# Patient Record
Sex: Male | Born: 1965
Health system: Southern US, Community
[De-identification: ages and names within clinical notes are randomized; demographics above are authoritative.]

## PROBLEM LIST (undated history)

## (undated) DIAGNOSIS — S83249A Other tear of medial meniscus, current injury, unspecified knee, initial encounter: Secondary | ICD-10-CM

## (undated) DIAGNOSIS — M1990 Unspecified osteoarthritis, unspecified site: Secondary | ICD-10-CM

## (undated) DIAGNOSIS — J45909 Unspecified asthma, uncomplicated: Secondary | ICD-10-CM

## (undated) DIAGNOSIS — K219 Gastro-esophageal reflux disease without esophagitis: Secondary | ICD-10-CM

## (undated) DIAGNOSIS — D6851 Activated protein C resistance: Secondary | ICD-10-CM

## (undated) HISTORY — PX: VASECTOMY: SHX75

## (undated) HISTORY — DX: Activated protein C resistance: D68.51

---

## 2013-11-18 ENCOUNTER — Other Ambulatory Visit: Payer: Self-pay | Admitting: Orthopedic Surgery

## 2013-11-18 NOTE — Progress Notes (Signed)
Dr. Shelle IronBeane [facility] has pre-op appointment on 11/23/2013 at 0900! Thank you!

## 2013-11-19 ENCOUNTER — Other Ambulatory Visit: Payer: Self-pay | Admitting: Orthopedic Surgery

## 2013-11-19 NOTE — H&P (Signed)
David Mann is an 48 y.o. male.   Chief Complaint: right knee pain HPI: The patient is a 48 year old male being followed for their right knee pain. They are now 6 month(s) out from when symptoms began. Symptoms reported today include: pain, aching and popping, while the patient does not report symptoms of: swelling, locking, giving way or instability. and report their pain level to be mild to moderate. Current treatment includes: bracing, activity modification, NSAIDs and pain medications. The following medication has been used for pain control: Norco and Aleve. The patient presents today following MRI. The patient has reported improvement of their symptoms with: Cortisone injections (helped for about a week and a half).  No past medical history on file.  No past surgical history on file.  No family history on file. Social History:  has no tobacco, alcohol, and drug history on file.  Allergies: Allergies not on file   (Not in a hospital admission)  No results found for this or any previous visit (from the past 48 hour(s)). No results found.  Review of Systems  Constitutional: Negative.   HENT: Negative.   Eyes: Negative.   Respiratory: Negative.   Gastrointestinal: Negative.   Genitourinary: Negative.   Musculoskeletal: Positive for joint pain.  Skin: Negative.   Neurological: Negative.   Endo/Heme/Allergies: Negative.   Psychiatric/Behavioral: Negative.     There were no vitals taken for this visit. Physical Exam  Constitutional: He is oriented to person, place, and time. He appears well-developed and well-nourished.  HENT:  Head: Normocephalic and atraumatic.  Eyes: Conjunctivae and EOM are normal. Pupils are equal, round, and reactive to light.  Neck: Normal range of motion. Neck supple.  Cardiovascular: Normal rate and regular rhythm.   Respiratory: Effort normal and breath sounds normal.  GI: Soft. Bowel sounds are normal.  Musculoskeletal:  General Mental Status -  Alert. General Appearance- pleasant. Not in acute distress. Orientation- Oriented X3. Build & Nutrition- Well nourished and Well developed. Gait- Limping.   Musculoskeletal Lower Extremity Right Lower Extremity: Right Knee: Inspection and Palpation:Tenderness- mid 1/3 of the medial joint line tender to palpation. no tenderness to palpation of the superior calf, no tenderness to palpation of the pes anserine bursa, no tenderness to palpation of the quadriceps tendon, no tenderness to palpation of the patellar tendon, no tenderness to palpation of the lateral joint line, no tenderness to palpation of the fibular head and no tenderness to palpation of the peroneal nerve. Swelling- none. Tissue tension/texture is - soft. Crepitus- mild. Pulses- 2+. Sensation- intact to light touch. Skin- Color- no ecchymosis and no erythema. Strength and Tone:Quadriceps- 5/5. Hamstrings- 5/5. ROM: Flexion:AROM- 120 . Extension:AROM- 0 . Stability- Valgus Laxity at 30- None. Valgus Laxity at 0- None. Varus Laxity at 30- None. Varus Laxity at 0- None. Lachman- Negative. Anterior Drawer Test - Negative. Posterior Drawer Test- Negative. Deformities/Malalignments/Discrepancies- no deformities noted. Special Tests:McMurray Test (lateral) - negative. McMurray Test (medial)- negative. Patellar Compression Pain- no pain.  Neurological: He is alert and oriented to person, place, and time. He has normal reflexes.  Skin: Skin is warm and dry.  Psychiatric: He has a normal mood and affect.    MRI with flap tear medial meniscus  Standing xrays with Tricompartmental degenerative changes noted, right worse than left, moderately severe, without collapse of the joint space.  Assessment/Plan R knee MMT  Pt with R knee pain, mechanical symptoms refractory to NSAIDs, pain meds, steroid injections, relative rest, activity modification, quad strengthening. Discussed tx  options. Given ongoing  pain and symptoms at this point recommend proceeding with R knee scope, partial medial meniscectomy. Discussed procedure itself as well as risks, complications, and alternatives including but not limited to DVT, PE, infx, bleeding, failure of procedure, need for secondary procedure, anesthesia risk, even death. Discussed post-op protocols. All questions answered and he desires to proceed.  Plan R knee arthroscopy, partial medial meniscectomy  BISSELL, JACLYN M. for Dr. Shelle Iron 11/19/2013, 1:27 PM

## 2013-11-22 ENCOUNTER — Encounter (HOSPITAL_COMMUNITY): Payer: Self-pay | Admitting: Pharmacy Technician

## 2013-11-22 NOTE — Patient Instructions (Addendum)
David Mann  11/22/2013                           YOUR PROCEDURE IS SCHEDULED ON: 11/25/13               PLEASE REPORT TO SHORT STAY CENTER AT : 8:00 AM               CALL THIS NUMBER IF ANY PROBLEMS THE DAY OF SURGERY :               [phone]                      REMEMBER:   Do not eat food or drink liquids AFTER MIDNIGHT   Take these medicines the morning of surgery with A SIP OF WATER: USE FLONASE NASAL SPRAY / ADVAIR INHALER   Do not wear jewelry, make-up   Do not wear lotions, powders, or perfumes.   Do not shave legs or underarms 12 hrs. before surgery (men may shave face)  Do not bring valuables to the hospital.  Contacts, dentures or bridgework may not be worn into surgery.  Leave suitcase in the car. After surgery it may be brought to your room.  For patients admitted to the hospital more than one night, checkout time is 11:00                          The day of discharge.   Patients discharged the day of surgery will not be allowed to drive home                             If going home same day of surgery, must have someone stay with you first                           24 hrs at home and arrange for some one to drive you home from hospital.    Special Instructions:   Please read over the following fact sheets that you were given:                        1.   [employer]                        2. [facility] PREPARING FOR SURGERY SHEET                                                X_____________________________________________________________________        Failure to follow these instructions may result in cancellation of your surgery

## 2013-11-23 ENCOUNTER — Encounter (HOSPITAL_COMMUNITY): Payer: Self-pay

## 2013-11-23 ENCOUNTER — Encounter (HOSPITAL_COMMUNITY)
Admission: RE | Admit: 2013-11-23 | Discharge: 2013-11-23 | Disposition: A | Discharge status: Home / Self Care | Payer: BC Managed Care – PPO | Source: Ambulatory Visit | Attending: Specialist | Admitting: Specialist

## 2013-11-23 ENCOUNTER — Encounter (INDEPENDENT_AMBULATORY_CARE_PROVIDER_SITE_OTHER): Payer: Self-pay

## 2013-11-23 HISTORY — DX: Unspecified osteoarthritis, unspecified site: M19.90

## 2013-11-23 HISTORY — DX: Other tear of medial meniscus, current injury, unspecified knee, initial encounter: S83.249A

## 2013-11-23 HISTORY — DX: Unspecified asthma, uncomplicated: J45.909

## 2013-11-23 HISTORY — DX: Gastro-esophageal reflux disease without esophagitis: K21.9

## 2013-11-23 LAB — CBC
HEMATOCRIT: 43 % (ref 39.0–52.0)
Hemoglobin: 14.3 g/dL (ref 13.0–17.0)
MCH: 31.4 pg (ref 26.0–34.0)
MCHC: 33.3 g/dL (ref 30.0–36.0)
MCV: 94.3 fL (ref 78.0–100.0)
Platelets: 249 10*3/uL (ref 150–400)
RBC: 4.56 MIL/uL (ref 4.22–5.81)
RDW: 14.5 % (ref 11.5–15.5)
WBC: 10.9 10*3/uL — AB (ref 4.0–10.5)

## 2013-11-23 NOTE — Progress Notes (Signed)
11/23/13 0900  OBSTRUCTIVE SLEEP APNEA  Have you ever been diagnosed with sleep apnea through a sleep study? No  Do you snore loudly (loud enough to be heard through closed doors)?  1  Do you often feel tired, fatigued, or sleepy during the daytime? 0  Has anyone observed you stop breathing during your sleep? 0  Do you have, or are you being treated for high blood pressure? 0  BMI more than 35 kg/m2? 1  Age over 47 years old? 0  Neck circumference greater than 40 cm/18 inches? 1  Gender: 1  Obstructive Sleep Apnea Score 4  Score 4 or greater  Results sent to PCP

## 2013-11-24 MED ORDER — DEXTROSE 5 % IV SOLN
3.0000 g | INTRAVENOUS | Status: AC
  Administered 2013-11-25: 3 g via INTRAVENOUS
  Filled 2013-11-24: qty 3000

## 2013-11-25 ENCOUNTER — Ambulatory Visit (HOSPITAL_COMMUNITY)
Admission: RE | Admit: 2013-11-25 | Discharge: 2013-11-25 | Disposition: A | Discharge status: Home / Self Care | Payer: BC Managed Care – PPO | Source: Ambulatory Visit | Attending: Specialist | Admitting: Specialist

## 2013-11-25 ENCOUNTER — Encounter (HOSPITAL_COMMUNITY): Payer: BC Managed Care – PPO | Admitting: Anesthesiology

## 2013-11-25 ENCOUNTER — Ambulatory Visit (HOSPITAL_COMMUNITY): Payer: BC Managed Care – PPO | Admitting: Anesthesiology

## 2013-11-25 ENCOUNTER — Encounter (HOSPITAL_COMMUNITY)
Admission: RE | Disposition: A | Discharge status: Home / Self Care | Payer: Self-pay | Source: Ambulatory Visit | Attending: Specialist

## 2013-11-25 ENCOUNTER — Encounter (HOSPITAL_COMMUNITY): Payer: Self-pay | Admitting: *Deleted

## 2013-11-25 DIAGNOSIS — IMO0002 Reserved for concepts with insufficient information to code with codable children: Secondary | ICD-10-CM | POA: Insufficient documentation

## 2013-11-25 DIAGNOSIS — M2240 Chondromalacia patellae, unspecified knee: Secondary | ICD-10-CM | POA: Insufficient documentation

## 2013-11-25 DIAGNOSIS — S83206A Unspecified tear of unspecified meniscus, current injury, right knee, initial encounter: Secondary | ICD-10-CM

## 2013-11-25 DIAGNOSIS — X58XXXA Exposure to other specified factors, initial encounter: Secondary | ICD-10-CM | POA: Insufficient documentation

## 2013-11-25 HISTORY — PX: KNEE ARTHROSCOPY WITH MENISCAL REPAIR: SHX5653

## 2013-11-25 SURGERY — ARTHROSCOPY, KNEE, WITH MENISCUS REPAIR
Anesthesia: General | Site: Knee | Laterality: Right

## 2013-11-25 MED ORDER — OXYCODONE-ACETAMINOPHEN 7.5-325 MG PO TABS: 1.0000 | ORAL_TABLET | ORAL | Status: DC | PRN

## 2013-11-25 MED ORDER — KETOROLAC TROMETHAMINE 30 MG/ML IJ SOLN
INTRAMUSCULAR | Status: AC
  Filled 2013-11-25: qty 1

## 2013-11-25 MED ORDER — PROPOFOL 10 MG/ML IV BOLUS
INTRAVENOUS | Status: AC
  Filled 2013-11-25: qty 20

## 2013-11-25 MED ORDER — EPINEPHRINE HCL 1 MG/ML IJ SOLN
INTRAMUSCULAR | Status: AC
  Filled 2013-11-25: qty 2

## 2013-11-25 MED ORDER — LACTATED RINGERS IR SOLN
Status: DC | PRN
  Administered 2013-11-25 (×2): 3000 mL

## 2013-11-25 MED ORDER — FENTANYL CITRATE 0.05 MG/ML IJ SOLN
25.0000 ug | INTRAMUSCULAR | Status: DC | PRN
  Administered 2013-11-25 (×2): 50 ug via INTRAVENOUS

## 2013-11-25 MED ORDER — LACTATED RINGERS IV SOLN
INTRAVENOUS | Status: DC
  Administered 2013-11-25: 1000 mL via INTRAVENOUS

## 2013-11-25 MED ORDER — LACTATED RINGERS IV SOLN
INTRAVENOUS | Status: DC | PRN
  Administered 2013-11-25: 10:00:00 via INTRAVENOUS

## 2013-11-25 MED ORDER — FENTANYL CITRATE 0.05 MG/ML IJ SOLN
INTRAMUSCULAR | Status: DC | PRN
  Administered 2013-11-25 (×4): 25 ug via INTRAVENOUS

## 2013-11-25 MED ORDER — FENTANYL CITRATE 0.05 MG/ML IJ SOLN
INTRAMUSCULAR | Status: AC
  Filled 2013-11-25: qty 2

## 2013-11-25 MED ORDER — ONDANSETRON HCL 4 MG/2ML IJ SOLN
INTRAMUSCULAR | Status: DC | PRN
  Administered 2013-11-25: 4 mg via INTRAVENOUS

## 2013-11-25 MED ORDER — MIDAZOLAM HCL 5 MG/5ML IJ SOLN
INTRAMUSCULAR | Status: DC | PRN
  Administered 2013-11-25: 2 mg via INTRAVENOUS

## 2013-11-25 MED ORDER — EPINEPHRINE HCL 1 MG/ML IJ SOLN
INTRAMUSCULAR | Status: DC | PRN
  Administered 2013-11-25 (×2): 1 mg

## 2013-11-25 MED ORDER — BUPIVACAINE-EPINEPHRINE 0.5% -1:200000 IJ SOLN
INTRAMUSCULAR | Status: DC | PRN
  Administered 2013-11-25: 16 mL

## 2013-11-25 MED ORDER — BUPIVACAINE-EPINEPHRINE PF 0.5-1:200000 % IJ SOLN
INTRAMUSCULAR | Status: AC
  Filled 2013-11-25: qty 30

## 2013-11-25 MED ORDER — PROPOFOL 10 MG/ML IV BOLUS
INTRAVENOUS | Status: DC | PRN
  Administered 2013-11-25: 300 mg via INTRAVENOUS

## 2013-11-25 MED ORDER — LACTATED RINGERS IV SOLN: INTRAVENOUS | Status: DC

## 2013-11-25 MED ORDER — PROMETHAZINE HCL 25 MG/ML IJ SOLN: 6.2500 mg | INTRAMUSCULAR | Status: DC | PRN

## 2013-11-25 MED ORDER — CHLORHEXIDINE GLUCONATE 4 % EX LIQD: 60.0000 mL | Freq: Once | CUTANEOUS | Status: DC

## 2013-11-25 MED ORDER — KETOROLAC TROMETHAMINE 30 MG/ML IJ SOLN
15.0000 mg | Freq: Once | INTRAMUSCULAR | Status: AC | PRN
  Administered 2013-11-25: 30 mg via INTRAVENOUS

## 2013-11-25 MED ORDER — OXYCODONE HCL 5 MG PO TABS
2.5000 mg | ORAL_TABLET | Freq: Once | ORAL | Status: AC
Start: 2013-11-25 — End: 2013-11-25
  Administered 2013-11-25: 2.5 mg via ORAL
  Filled 2013-11-25: qty 1

## 2013-11-25 MED ORDER — ONDANSETRON HCL 4 MG/2ML IJ SOLN
INTRAMUSCULAR | Status: AC
  Filled 2013-11-25: qty 2

## 2013-11-25 MED ORDER — MIDAZOLAM HCL 2 MG/2ML IJ SOLN
INTRAMUSCULAR | Status: AC
  Filled 2013-11-25: qty 2

## 2013-11-25 MED ORDER — OXYCODONE-ACETAMINOPHEN 5-325 MG PO TABS
1.0000 | ORAL_TABLET | Freq: Once | ORAL | Status: AC
  Administered 2013-11-25: 1 via ORAL
  Filled 2013-11-25: qty 1

## 2013-11-25 SURGICAL SUPPLY — 19 items
BANDAGE ELASTIC 6 VELCRO ST LF (GAUZE/BANDAGES/DRESSINGS) ×3 IMPLANT
BLADE 4.2CUDA (BLADE) IMPLANT
BLADE CUDA SHAVER 3.5 (BLADE) ×3 IMPLANT
CLOTH 2% CHLOROHEXIDINE 3PK (PERSONAL CARE ITEMS) ×3 IMPLANT
DRSG EMULSION OIL 3X3 NADH (GAUZE/BANDAGES/DRESSINGS) ×3 IMPLANT
DURAPREP 26ML APPLICATOR (WOUND CARE) ×3 IMPLANT
GLOVE BIOGEL PI IND STRL 7.5 (GLOVE) IMPLANT
GLOVE BIOGEL PI INDICATOR 7.5 (GLOVE)
GLOVE SURG SS PI 7.5 STRL IVOR (GLOVE) IMPLANT
GLOVE SURG SS PI 8.0 STRL IVOR (GLOVE) ×6 IMPLANT
GOWN STRL REUS W/TWL XL LVL3 (GOWN DISPOSABLE) ×3 IMPLANT
MANIFOLD NEPTUNE II (INSTRUMENTS) ×3 IMPLANT
PACK ARTHROSCOPY WL (CUSTOM PROCEDURE TRAY) ×3 IMPLANT
PAD ABD 8X10 STRL (GAUZE/BANDAGES/DRESSINGS) ×3 IMPLANT
PADDING CAST COTTON 6X4 STRL (CAST SUPPLIES) ×3 IMPLANT
SET ARTHROSCOPY TUBING (MISCELLANEOUS) ×2
SET ARTHROSCOPY TUBING LN (MISCELLANEOUS) ×1 IMPLANT
SUT ETHILON 4 0 PS 2 18 (SUTURE) ×3 IMPLANT
WRAP KNEE MAXI GEL POST OP (GAUZE/BANDAGES/DRESSINGS) ×3 IMPLANT

## 2013-11-25 NOTE — Discharge Instructions (Signed)

## 2013-11-25 NOTE — Progress Notes (Signed)
Crutches given to pt for  Home use and instructions given to patient on how to walk on crutches

## 2013-11-25 NOTE — Anesthesia Preprocedure Evaluation (Signed)
Anesthesia Evaluation  Patient identified by MRN, date of birth, ID band Patient awake    Reviewed: Allergy & Precautions, H&P , NPO status , Patient's Chart, lab work & pertinent test results  Airway Mallampati: II TM Distance: >3 FB Neck ROM: Full    Dental no notable dental hx.    Pulmonary neg pulmonary ROS,  breath sounds clear to auscultation  Pulmonary exam normal       Cardiovascular negative cardio ROS  Rhythm:Regular Rate:Normal     Neuro/Psych negative neurological ROS  negative psych ROS   GI/Hepatic negative GI ROS, Neg liver ROS,   Endo/Other  negative endocrine ROS  Renal/GU negative Renal ROS  negative genitourinary   Musculoskeletal negative musculoskeletal ROS (+)   Abdominal   Peds negative pediatric ROS (+)  Hematology negative hematology ROS (+)   Anesthesia Other Findings   Reproductive/Obstetrics negative OB ROS                          Anesthesia Physical Anesthesia Plan  ASA: II  Anesthesia Plan: General   Post-op Pain Management:    Induction: Intravenous  Airway Management Planned: LMA  Additional Equipment:   Intra-op Plan:   Post-operative Plan:   Informed Consent: I have reviewed the patients History and Physical, chart, labs and discussed the procedure including the risks, benefits and alternatives for the proposed anesthesia with the patient or authorized representative who has indicated his/her understanding and acceptance.   Dental advisory given  Plan Discussed with: CRNA and Surgeon  Anesthesia Plan Comments:         Anesthesia Quick Evaluation  

## 2013-11-25 NOTE — Anesthesia Postprocedure Evaluation (Signed)
  Anesthesia Post-op Note  Patient: David Mann  Procedure(s) Performed: Procedure(s) (LRB): RIGHT KNEE ARTHROSCOPY WITH PARTIAL MEDIAL MENISECTOMY and DEBRIDEMENT (Right)  Patient Location: PACU  Anesthesia Type: General  Level of Consciousness: awake and alert   Airway and Oxygen Therapy: Patient Spontanous Breathing  Post-op Pain: mild  Post-op Assessment: Post-op Vital signs reviewed, Patient's Cardiovascular Status Stable, Respiratory Function Stable, Patent Airway and No signs of Nausea or vomiting  Last Vitals:  Filed Vitals:   11/25/13 1109  BP:   Pulse:   Temp: 36.6 C  Resp: 16    Post-op Vital Signs: stable   Complications: No apparent anesthesia complications

## 2013-11-25 NOTE — Transfer of Care (Signed)
Immediate Anesthesia Transfer of Care Note  Patient: David Mann  Procedure(s) Performed: Procedure(s) (LRB): RIGHT KNEE ARTHROSCOPY WITH PARTIAL MEDIAL MENISECTOMY and DEBRIDEMENT (Right)  Patient Location: PACU  Anesthesia Type: General  Level of Consciousness: sedated, patient cooperative and responds to stimulation  Airway & Oxygen Therapy: Patient Spontanous Breathing and Patient connected to face mask oxgen  Post-op Assessment: Report given to PACU RN and Post -op Vital signs reviewed and stable  Post vital signs: Reviewed and stable  Complications: No apparent anesthesia complications

## 2013-11-25 NOTE — Brief Op Note (Signed)
11/25/2013  11:02 AM  PATIENT:  Cicero DuckMark Gronewold  48 y.o. male  PRE-OPERATIVE DIAGNOSIS:  RIGHT MEDIAL MENISCAL TEAR  POST-OPERATIVE DIAGNOSIS:  RIGHT MEDIAL MENISCAL TEAR  PROCEDURE:  Procedure(s): RIGHT KNEE ARTHROSCOPY WITH PARTIAL MEDIAL MENISECTOMY and DEBRIDEMENT (Right)  SURGEON:  Surgeon(s) and Role:    * Javier DockerJeffrey C Keana Dueitt, MD - Primary  PHYSICIAN ASSISTANT:   ASSISTANTS: none   ANESTHESIA:   none  EBL:     BLOOD ADMINISTERED:none  DRAINS: none   LOCAL MEDICATIONS USED:  MARCAINE     SPECIMEN:  No Specimen  DISPOSITION OF SPECIMEN:  N/A  COUNTS:  YES  TOURNIQUET:  * No tourniquets in log *  DICTATION: .Other Dictation: Dictation Number [id number]  PLAN OF CARE: Discharge to home after PACU  PATIENT DISPOSITION:  PACU - hemodynamically stable.   Delay start of Pharmacological VTE agent (>24hrs) due to surgical blood loss or risk of bleeding: no

## 2013-11-25 NOTE — Interval H&P Note (Signed)
History and Physical Interval Note:  11/25/2013 7:27 AM  David Mann  has presented today for surgery, with the diagnosis of RIGHT MEDIAL MENISCAL TEAR  The various methods of treatment have been discussed with the patient and family. After consideration of risks, benefits and other options for treatment, the patient has consented to  Procedure(s): RIGHT KNEE ARTHROSCOPY WITH PARTIAL MEDIAL MENISECTOMY (Right) as a surgical intervention .  [name] history has been reviewed, patient examined, no change in status, stable for surgery.  I have reviewed the patient's chart and labs.  Questions were answered to the patient's satisfaction.     Imraan Wendell C

## 2013-11-25 NOTE — H&P (View-Only) (Signed)
David Mann is an 48 y.o. male.   Chief Complaint: right knee pain HPI: The patient is a 48 year old male being followed for their right knee pain. They are now 6 month(s) out from when symptoms began. Symptoms reported today include: pain, aching and popping, while the patient does not report symptoms of: swelling, locking, giving way or instability. and report their pain level to be mild to moderate. Current treatment includes: bracing, activity modification, NSAIDs and pain medications. The following medication has been used for pain control: Norco and Aleve. The patient presents today following MRI. The patient has reported improvement of their symptoms with: Cortisone injections (helped for about a week and a half).  No past medical history on file.  No past surgical history on file.  No family history on file. Social History:  has no tobacco, alcohol, and drug history on file.  Allergies: Allergies not on file   (Not in a hospital admission)  No results found for this or any previous visit (from the past 48 hour(s)). No results found.  Review of Systems  Constitutional: Negative.   HENT: Negative.   Eyes: Negative.   Respiratory: Negative.   Gastrointestinal: Negative.   Genitourinary: Negative.   Musculoskeletal: Positive for joint pain.  Skin: Negative.   Neurological: Negative.   Endo/Heme/Allergies: Negative.   Psychiatric/Behavioral: Negative.     There were no vitals taken for this visit. Physical Exam  Constitutional: He is oriented to person, place, and time. He appears well-developed and well-nourished.  HENT:  Head: Normocephalic and atraumatic.  Eyes: Conjunctivae and EOM are normal. Pupils are equal, round, and reactive to light.  Neck: Normal range of motion. Neck supple.  Cardiovascular: Normal rate and regular rhythm.   Respiratory: Effort normal and breath sounds normal.  GI: Soft. Bowel sounds are normal.  Musculoskeletal:  General Mental Status -  Alert. General Appearance- pleasant. Not in acute distress. Orientation- Oriented X3. Build & Nutrition- Well nourished and Well developed. Gait- Limping.   Musculoskeletal Lower Extremity Right Lower Extremity: Right Knee: Inspection and Palpation:Tenderness- mid 1/3 of the medial joint line tender to palpation. no tenderness to palpation of the superior calf, no tenderness to palpation of the pes anserine bursa, no tenderness to palpation of the quadriceps tendon, no tenderness to palpation of the patellar tendon, no tenderness to palpation of the lateral joint line, no tenderness to palpation of the fibular head and no tenderness to palpation of the peroneal nerve. Swelling- none. Tissue tension/texture is - soft. Crepitus- mild. Pulses- 2+. Sensation- intact to light touch. Skin- Color- no ecchymosis and no erythema. Strength and Tone:Quadriceps- 5/5. Hamstrings- 5/5. ROM: Flexion:AROM- 120 . Extension:AROM- 0 . Stability- Valgus Laxity at 30- None. Valgus Laxity at 0- None. Varus Laxity at 30- None. Varus Laxity at 0- None. Lachman- Negative. Anterior Drawer Test - Negative. Posterior Drawer Test- Negative. Deformities/Malalignments/Discrepancies- no deformities noted. Special Tests:McMurray Test (lateral) - negative. McMurray Test (medial)- negative. Patellar Compression Pain- no pain.  Neurological: He is alert and oriented to person, place, and time. He has normal reflexes.  Skin: Skin is warm and dry.  Psychiatric: He has a normal mood and affect.    MRI with flap tear medial meniscus  Standing xrays with Tricompartmental degenerative changes noted, right worse than left, moderately severe, without collapse of the joint space.  Assessment/Plan R knee MMT  Pt with R knee pain, mechanical symptoms refractory to NSAIDs, pain meds, steroid injections, relative rest, activity modification, quad strengthening. Discussed tx  options. Given ongoing  pain and symptoms at this point recommend proceeding with R knee scope, partial medial meniscectomy. Discussed procedure itself as well as risks, complications, and alternatives including but not limited to DVT, PE, infx, bleeding, failure of procedure, need for secondary procedure, anesthesia risk, even death. Discussed post-op protocols. All questions answered and he desires to proceed.  Plan R knee arthroscopy, partial medial meniscectomy  BISSELL, JACLYN M. for Dr. Shelle Iron 11/19/2013, 1:27 PM

## 2013-11-26 ENCOUNTER — Encounter (HOSPITAL_COMMUNITY): Payer: Self-pay | Admitting: Specialist

## 2013-11-26 NOTE — Op Note (Signed)
[name]:  David Mann, David Mann                 ACCOUNT NO.:  [id number]  MEDICAL RECORD NO.:  [id number]  LOCATION:  [facility]                         [facility]  PHYSICIAN:  Jene EveryJeffrey Hassan Blackshire, M.D.    DATE OF BIRTH:  [birth year]  DATE OF PROCEDURE:  11/25/2013 DATE OF DISCHARGE:  11/25/2013                              OPERATIVE REPORT   PREOPERATIVE DIAGNOSIS:  Medial meniscus tear of the right knee.  POSTOPERATIVE DIAGNOSIS:  Medial meniscus tear of the right knee, grade 3 chondromalacia, medial femoral condyle, medial tibial plateau, and patella.  PROCEDURE PERFORMED: 1. Right knee arthroscopy. 2. Partial medial meniscectomy. 3. Chondroplasty of medial femoral condyle, tibial plateau, and     patella.  ANESTHESIA:  General.  ASSISTANT:  None.  HISTORY:  This is a 48 year old with locking, popping, and giving way. MRI indicating meniscus tear indicated for partial resection with some degenerative changes.  Risk and benefits discussed including bleeding, infection, damage to neurovascular structures.  DVT, PE, and anesthetic complications, etc.  TECHNIQUE:  With the patient in supine position, after induction of adequate anesthesia, 3 g Kefzol.  The right lower extremity was prepped draped in usual sterile fashion.  A lateral parapatellar portal was fashioned with a #11 blade.  Ingress cannula was atraumatically placed. Irrigant was utilized to insufflate the joint.  Under direct visualization, a medial parapatellar portal was fashioned with a #11 blade after localization with 18-gauge needle sparing the medial meniscus.  Noted, was grade 3 changes of femoral condyle and tibial plateau and approximately half the weightbearing surface.  There was a complex tear of the junction of the anterior and posterior half of the meniscus.  I resected the meniscal tear with a straight and up-biting basket.  This extended back into the posterior 3rd, was cleared in complex.  There were combination  of baskets and contouring with 3.5 Cuda shaver.  Resected it to a stable base.  Approximately 50% of the meniscus was resected in this portion.  The remnant was stable to probe palpation.  Light chondroplasty was performed.  Femoral condyle, tibial plateau, and loose cartilaginous debris was evacuated.  ACL was unremarkable.  Lateral compartment revealed normal femoral condyle, tibial plateau, and meniscus stable to probe palpation.  Suprapatellar pouch revealed normal patellofemoral tracking, minor grade 3 changes of the patella.  Light chondroplasty performed here.  Gutters were unremarkable.  I debrided all compartments.  No further pathology amenable to arthroscopic intervention.  I, therefore, removed all instrumentation.  Portals were closed with 4-0 nylon simple sutures, 0.25% Marcaine with epinephrine was infiltrated in the joint.  Wound was dressed sterilely.  Awoken without difficulty and transported to the recovery room in satisfactory condition.  The patient tolerated the procedure well.  No complications.  No assistant.  Minimal blood loss.     Jene EveryJeffrey Stachia Slutsky, M.D.     [name]/MEDQ  D:  11/25/2013  T:  11/26/2013  Job:  [id number]

## 2014-04-11 ENCOUNTER — Encounter (HOSPITAL_COMMUNITY): Payer: Self-pay | Admitting: Emergency Medicine

## 2014-04-11 ENCOUNTER — Emergency Department (HOSPITAL_COMMUNITY)
Admission: EM | Admit: 2014-04-11 | Discharge: 2014-04-11 | Disposition: A | Discharge status: Home / Self Care | Payer: BC Managed Care – PPO | Attending: Emergency Medicine | Admitting: Emergency Medicine

## 2014-04-11 DIAGNOSIS — Z79899 Other long term (current) drug therapy: Secondary | ICD-10-CM | POA: Insufficient documentation

## 2014-04-11 DIAGNOSIS — I82819 Embolism and thrombosis of superficial veins of unspecified lower extremities: Secondary | ICD-10-CM | POA: Insufficient documentation

## 2014-04-11 DIAGNOSIS — M79609 Pain in unspecified limb: Secondary | ICD-10-CM

## 2014-04-11 DIAGNOSIS — Z8739 Personal history of other diseases of the musculoskeletal system and connective tissue: Secondary | ICD-10-CM | POA: Insufficient documentation

## 2014-04-11 DIAGNOSIS — IMO0002 Reserved for concepts with insufficient information to code with codable children: Secondary | ICD-10-CM | POA: Insufficient documentation

## 2014-04-11 DIAGNOSIS — K219 Gastro-esophageal reflux disease without esophagitis: Secondary | ICD-10-CM | POA: Insufficient documentation

## 2014-04-11 DIAGNOSIS — I82811 Embolism and thrombosis of superficial veins of right lower extremities: Secondary | ICD-10-CM

## 2014-04-11 DIAGNOSIS — J45909 Unspecified asthma, uncomplicated: Secondary | ICD-10-CM | POA: Insufficient documentation

## 2014-04-11 NOTE — Progress Notes (Addendum)
Right lower extremity venous duplex completed.  Right:  No evidence of DVTor Baker's cyst.  There appears to be superficial thrombosis in the right greater saphenous vein.  Left:  Negative for DVT in the common femoral vein.

## 2014-04-11 NOTE — Discharge Instructions (Signed)
May take over the counter aspirin and apply heat to affected area to help stimulate blood flow. Follow-up with vascular surgery. Return to the ED for new concerns.

## 2014-04-11 NOTE — ED Provider Notes (Signed)
CSN: [id number]     Arrival date & time 04/11/14  1105 History   First MD Initiated Contact with Patient 04/11/14 1131     Chief Complaint  Patient presents with  . Leg Pain     (Consider location/radiation/quality/duration/timing/severity/associated sxs/prior Treatment) Patient is a 48 y.o. male presenting with leg pain. The history is provided by the patient and medical records.  Leg Pain  This is a 48 year old male past medical history significant for asthma and arthritis, presenting to the ED for right calf pain which started on Friday and and progressively worsening. He states his right medial calf is tender to palpation, and when walking there is a tightness and "pulling" sensation. He denies any recent surgeries, long-distance travel, or prolonged immobilizations.  Patient has no personal hx of DVT or PE.  Mother has protein S deficiency and Factor 5 Leiden mutation and has had multiple DVTs.  Patient has no known clotting disorders.  No fever or chills.  Denies any chest pain or SOB.  Pt states he was seen 1 month ago for similar complaints and put on muscle relaxer without improvement of symptoms.   Past Medical History  Diagnosis Date  . Asthma   . Medial meniscus tear     RT  . Arthritis   . GERD (gastroesophageal reflux disease)     OCCASIONAL   Past Surgical History  Procedure Laterality Date  . Vasectomy    . Knee arthroscopy with meniscal repair Right 11/25/2013    Procedure: RIGHT KNEE ARTHROSCOPY WITH PARTIAL MEDIAL MENISECTOMY and DEBRIDEMENT;  Surgeon: Javier DockerJeffrey C Beane, MD;  Location: [facility];  Service: Orthopedics;  Laterality: Right;   History reviewed. No pertinent family history. History  Substance Use Topics  . Smoking status: Never Smoker   . Smokeless tobacco: Not on file  . Alcohol Use: Yes     Comment: OCCASIONAL    Review of Systems  Musculoskeletal: Positive for myalgias (R calf pain).  All other systems reviewed and are negative.     Allergies   Review of patient's allergies indicates no known allergies.  Home Medications   Prior to Admission medications   Medication Sig Start Date End Date Taking? Authorizing Provider  albuterol (PROVENTIL HFA;VENTOLIN HFA) 108 (90 BASE) MCG/ACT inhaler Inhale 2 puffs into the lungs every 6 (six) hours as needed for wheezing or shortness of breath.    Historical Provider, MD  fluticasone (FLONASE) 50 MCG/ACT nasal spray Place 2 sprays into both nostrils daily.    Historical Provider, MD  Fluticasone-Salmeterol (ADVAIR) 250-50 MCG/DOSE AEPB Inhale 1 puff into the lungs 2 (two) times daily.    Historical Provider, MD  montelukast (SINGULAIR) 10 MG tablet Take 10 mg by mouth at bedtime.    Historical Provider, MD  oxyCODONE-acetaminophen (PERCOCET) 7.5-325 MG per tablet Take 1-2 tablets by mouth every 4 (four) hours as needed for pain. 11/25/13   Javier DockerJeffrey C Beane, MD   BP 138/79  Pulse 86  Temp(Src) 98.1 F (36.7 C) (Oral)  Resp 18  SpO2 98%  Physical Exam  Nursing note and vitals reviewed. Constitutional: He is oriented to person, place, and time. He appears well-developed and well-nourished. No distress.  HENT:  Head: Normocephalic and atraumatic.  Mouth/Throat: Oropharynx is clear and moist.  Eyes: Conjunctivae and EOM are normal. Pupils are equal, round, and reactive to light.  Neck: Normal range of motion. Neck supple.  Cardiovascular: Normal rate, regular rhythm and normal heart sounds.   Pulmonary/Chest: Effort normal and breath  sounds normal. No respiratory distress. He has no wheezes.  Musculoskeletal: Normal range of motion.  Right calf with tenderness along medial aspect; small area of bruising with localized tenderness; no appreciable calf asymmetry or palpable cords; no overlying erythema or warmth to touch; DP pulses intact; ambulating without difficulty  Neurological: He is alert and oriented to person, place, and time.  Skin: Skin is warm and dry. He is not diaphoretic.   Psychiatric: He has a normal mood and affect.    ED Course  Procedures (including critical care time) Labs Review Labs Reviewed - No data to display  Imaging Review No results found.  Editor: Smiley HousemanSandra E Biggs, RVT (Cardiovascular Sonographer)     Related Notes: Original Note by Smiley HousemanSandra E Biggs, RVT (Cardiovascular Sonographer) filed at 04/11/2014 1:36 PM    Right lower extremity venous duplex completed. Right: No evidence of DVTor Baker's cyst. There appears to be superficial thrombosis in the right greater saphenous vein. Left: Negative for DVT in the common femoral vein.      EKG Interpretation None      MDM   Final diagnoses:  Superficial thrombosis of right lower extremity   LE venous duplex revealing superficial thrombosis of right greater saphenous vein, negative for DVT. No signs/sx concerning for PE. Recommended ASA and heat therapy.  He will FU with vascular surgery if problems occur.  Discussed plan with patient, he/she acknowledged understanding and agreed with plan of care.  Return precautions given for new or worsening symptoms.  Garlon HatchetLisa M Sanders, PA-C 04/11/14 1517

## 2014-04-11 NOTE — ED Notes (Signed)
Pt was seen a month ago for leg pain and put on muscle relaxer.  Pt now with calf pain to right leg.  Started on Friday.

## 2014-04-13 ENCOUNTER — Encounter: Payer: Self-pay | Admitting: Vascular Surgery

## 2014-04-14 NOTE — ED Provider Notes (Signed)
Medical screening examination/treatment/procedure(s) were performed by non-physician practitioner and as supervising physician I was immediately available for consultation/collaboration.   Javan Gonzaga T Quida Glasser, MD 04/14/14 1506 

## 2014-04-18 ENCOUNTER — Encounter: Payer: Self-pay | Admitting: Vascular Surgery

## 2014-04-19 ENCOUNTER — Encounter: Payer: Self-pay | Admitting: Vascular Surgery

## 2014-04-19 ENCOUNTER — Ambulatory Visit (INDEPENDENT_AMBULATORY_CARE_PROVIDER_SITE_OTHER): Payer: BC Managed Care – PPO | Admitting: Vascular Surgery

## 2014-04-19 VITALS — BP 132/76 | HR 76 | Resp 18 | Ht 72.0 in | Wt 265.3 lb

## 2014-04-19 DIAGNOSIS — I809 Phlebitis and thrombophlebitis of unspecified site: Secondary | ICD-10-CM

## 2014-04-19 NOTE — Progress Notes (Signed)
[facility] [place] HISTORY AND PHYSICAL   CC:  F/u for superficial thrombophlebitis  Louie Bostonapper, David B., MD  HPI: This is a 48 y.o. male who presented to the ED last week with complaints of pain in his right lower leg.  He states that he had been having pain for a couple of days that would not resolve.  He did have a venous doppler in the ED that revealed no evidence of DVT involving the RLE and left common femoral vein.  There was superficial thrombosis in the right greater saphenous vein.  There was no evidence of Baker's cyst on the right. He states he did have a knee arthroscopy on the right in January.  He does state that his mother has a hx of PE with protein S deficiency as well as Factor V Leiden and is on Coumadin.  He is on medications for his asthma.  Past Medical History  Diagnosis Date  . Asthma   . Medial meniscus tear     RT  . Arthritis   . GERD (gastroesophageal reflux disease)     OCCASIONAL   Past Surgical History  Procedure Laterality Date  . Vasectomy    . Knee arthroscopy with meniscal repair Right 11/25/2013    Procedure: RIGHT KNEE ARTHROSCOPY WITH PARTIAL MEDIAL MENISECTOMY and DEBRIDEMENT;  Surgeon: Javier DockerJeffrey C Beane, MD;  Location: [facility];  Service: Orthopedics;  Laterality: Right;    No Known Allergies  Current Outpatient Prescriptions  Medication Sig Dispense Refill  . albuterol (PROVENTIL HFA;VENTOLIN HFA) 108 (90 BASE) MCG/ACT inhaler Inhale 2 puffs into the lungs every 6 (six) hours as needed for wheezing or shortness of breath.      . fluticasone (FLONASE) 50 MCG/ACT nasal spray Place 2 sprays into both nostrils daily.      . Fluticasone-Salmeterol (ADVAIR) 250-50 MCG/DOSE AEPB Inhale 1 puff into the lungs 2 (two) times daily.      . montelukast (SINGULAIR) 10 MG tablet Take 10 mg by mouth at bedtime.      Marland Kitchen. oxyCODONE-acetaminophen (PERCOCET) 7.5-325 MG per tablet Take 1-2 tablets by mouth every 4 (four) hours as needed for  pain.  40 tablet  0   No current facility-administered medications for this visit.    Family History  Problem Relation Age of Onset  . Pulmonary embolism Mother   . Deep vein thrombosis Mother     History   Social History  . Marital Status: Married    Spouse Name: N/A    Number of Children: N/A  . Years of Education: N/A   Occupational History  . Not on file.   Social History Main Topics  . Smoking status: Never Smoker   . Smokeless tobacco: Not on file  . Alcohol Use: Yes     Comment: OCCASIONAL  . Drug Use: No  . Sexual Activity: Not on file   Other Topics Concern  . Not on file   Social History Narrative  . No narrative on file     ROS: [x]  Positive   [ ]  Negative   [ ]  All sytems reviewed and are negative  Cardiovascular: []  chest pain/pressure []  palpitations []  SOB lying flat []  DOE [x]  pain in left leg while walking []  pain in feet when lying flat []  hx of DVT []  hx of phlebitis []  swelling in legs []  varicose veins  Pulmonary: []  productive cough [x]  asthma []  wheezing  Neurologic: []  weakness in []  arms []  legs []  numbness  in []  arms []  legs [] difficulty speaking or slurred speech []  temporary loss of vision in one eye []  dizziness  Hematologic: []  bleeding problems []  problems with blood clotting easily  GI []  vomiting blood []  blood in stool  GU: []  burning with urination []  blood in urine  Psychiatric: []  hx of major depression  Integumentary: []  rashes []  ulcers  Constitutional: []  fever []  chills   PHYSICAL EXAMINATION:  Filed Vitals:   04/19/14 1236  BP: 132/76  Pulse: 76  Resp: 18   Body mass index is 35.97 kg/(m^2).  General:  WDWN in NAD Gait: Not observed HENT: WNL, normocephalic Eyes: Pupils equal Pulmonary: normal non-labored breathing  Cardiac: RRR,  Skin: without rashes, without ulcers  Vascular Exam/Pulses:    Extremities: without ischemic changes, without Gangrene , without cellulitis;  without open wounds;  Musculoskeletal: no muscle wasting or atrophy  Neurologic: A&O X 3; Appropriate Affect ; SENSATION: normal; MOTOR FUNCTION:  moving all extremities equally. Speech is fluent/normal   Non-Invasive Vascular Imaging:    Duplex by Dr. Arbie CookeyEarly today reveals clot in the greater saphenous vein from below mid calf extending up the thigh.  Duplex 04/11/14 in the ED:  No evidence of DVT involving the right lower extremity and left common femoral vein.  There appears to be superficial thrombosis in the right greater saphenous vein and no evidence of Baker's cyst on the right.  Pt meds includes: Statin:  no Beta Blocker:  no Aspirin:  no ACEI:  no ARB:  no Other Antiplatelet/Anticoagulant:  no   ASSESSMENT/PLAN:: 48 y.o. male with recent hx of superficial thrombophlebitis of the right lower leg.    -the pt's symptoms have greatly improved.  The pt did have a right knee arthroscopy in January, but given his symptoms and significant family hx with his mother having Protein S deficiency and his surgery being 6 months ago, the pt will need a complete hypercoagulable workup in the near future.  Dr. Arbie CookeyEarly did explain the importance of this workup and pt understands and agrees.     Doreatha MassedSamantha Rhyne, PA-C [facility] [phone]  Clinic MD:  Pt seen and examined in conjunction with Dr. Arbie CookeyEarly  I have examined the patient, reviewed and agree with above. I did reimage the saphenous vein with SonoSite ultrasound does show thrombus from mid calf up to the proximal thigh. Explain treatment would be observation only and time. This is concerning for a possible hypercoagulability. He will arrange this to Dr. Jackolyn Conferapper's office. His mother has [place] hemat[place] do recommend screening for this.  EARLY, TODD, MD 04/19/2014 2:12 PM

## 2016-04-03 DIAGNOSIS — Z0000 Encounter for general adult medical examination without abnormal findings: Secondary | ICD-10-CM | POA: Diagnosis not present

## 2016-05-17 ENCOUNTER — Ambulatory Visit (INDEPENDENT_AMBULATORY_CARE_PROVIDER_SITE_OTHER): Payer: BLUE CROSS/BLUE SHIELD | Admitting: Neurology

## 2016-05-17 ENCOUNTER — Encounter: Payer: Self-pay | Admitting: Neurology

## 2016-05-17 VITALS — BP 133/86 | HR 64 | Ht 72.0 in | Wt 266.0 lb

## 2016-05-17 DIAGNOSIS — G932 Benign intracranial hypertension: Secondary | ICD-10-CM

## 2016-05-17 DIAGNOSIS — R252 Cramp and spasm: Secondary | ICD-10-CM | POA: Insufficient documentation

## 2016-05-17 DIAGNOSIS — H4710 Unspecified papilledema: Secondary | ICD-10-CM | POA: Diagnosis not present

## 2016-05-17 MED ORDER — ACETAZOLAMIDE 125 MG PO TABS: 125.0000 mg | ORAL_TABLET | Freq: Two times a day (BID) | ORAL | Status: DC

## 2016-05-17 NOTE — Progress Notes (Signed)
[facility] [address] [place]. [place] [address] [phone]       OFFICE CONSULT NOTE  Mr. David Mann Date of Birth:  02-14-1966 Medical Record Number:  [id number]   Referring MD:  Anthony Sar, OD Reason for Referral:  Optic disc swelling  HPI: David Mann is a 50 year old pleasant Caucasian male who was recently found to have optic disc swelling on funduscopic exam by optometrist when he had gone for a routine eye exam. He had been having some difficulty reading for more than a year. He had a previous eye exam done by different optometrist who had not noticed any optic disc edema however the glasses that he had prescribed were not working since patient went to see a new optometrist this time. Patient denies any ongoing headaches or loss of vision. He denies any double vision, vertigo, nausea, gait or balance problems. Denies any recent rashes, tick bites or travel outside the country. He admits to  Barely 2 or  3  headache episodes over the last several years which sound like migraines. He does have family history of migraine his sister. Patient does admit to having intentionally lost about 35 pounds over the last year or so by cutting out mainly pseudomonas and junk food. He has not had any recent lab work, brain imaging done. He denies any seizures, loss of consciousness, significant head injury. She does not take excess vitamins.  ROS:   14 system review of systems is positive for blurred vision, ringing in the ears, joint pain, aching muscles, allergies, memory loss, headache and all other systems negative  PMH:  Past Medical History  Diagnosis Date  . Asthma   . Medial meniscus tear     RT  . Arthritis   . GERD (gastroesophageal reflux disease)     OCCASIONAL  . Factor 5 Leiden mutation, heterozygous [facility])     Social History:  Social History   Social History  . Marital Status: Married    Spouse Name: N/A  . Number of Children: N/A  . Years of Education:  N/A   Occupational History  . Not on file.   Social History Main Topics  . Smoking status: Never Smoker   . Smokeless tobacco: Not on file  . Alcohol Use: 0.6 oz/week    1 Cans of beer per week     Comment: OCCASIONAL  . Drug Use: No  . Sexual Activity: Not on file   Other Topics Concern  . Not on file   Social History Narrative    Medications:   Current Outpatient Prescriptions on File Prior to Visit  Medication Sig Dispense Refill  . albuterol (PROVENTIL HFA;VENTOLIN HFA) 108 (90 BASE) MCG/ACT inhaler Inhale 2 puffs into the lungs every 6 (six) hours as needed for wheezing or shortness of breath.    . fluticasone (FLONASE) 50 MCG/ACT nasal spray Place 2 sprays into both nostrils daily.    . montelukast (SINGULAIR) 10 MG tablet Take 10 mg by mouth at bedtime.     No current facility-administered medications on file prior to visit.    Allergies:  No Known Allergies  Physical Exam General:Mildly obese young Caucasian male, seated, in no evident distress Head: head normocephalic and atraumatic.   Neck: supple with no carotid or supraclavicular bruits Cardiovascular: regular rate and rhythm, no murmurs Musculoskeletal: no deformity Skin:  no rash/petichiae Vascular:  Normal pulses all extremities  Neurologic Exam Mental Status: Awake and fully alert. Oriented to place and time. Recent  and remote memory intact. Attention span, concentration and fund of knowledge appropriate. Mood and affect appropriate.  Cranial Nerves: Fundoscopic exam reveals s shows mild bilateral papilledema with absent venous pulsations. There are no peripapillary hemorrhages or exudates noted. Vision acuity is normal bilaterally and visual fields are full to bedside confrontational testing. There is no afferent pupillary defect.. Pupils equal, briskly reactive to light. Extraocular movements full without nystagmus.  Hearing intact. Facial sensation intact. Face, tongue, palate moves normally and  symmetrically.  Motor: Normal bulk and tone. Normal strength in all tested extremity muscles. Sensory.: intact to touch , pinprick , position and vibratory sensation.  Coordination: Rapid alternating movements normal in all extremities. Finger-to-nose and heel-to-shin performed accurately bilaterally. Gait and Station: Arises from chair without difficulty. Stance is normal. Gait demonstrates normal stride length and balance . Able to heel, toe and tandem walk without difficulty.  Reflexes: 1+ and symmetric. Toes downgoing.       ASSESSMENT: 50 year old Caucasian male with mild bilateral optic disc edema likely due to benign intracranial hypertension. Risk factors likely sick significant recent weight loss. Denies significant headaches or vision difficulties.    PLAN: I had a long discussion with the patient with regards to his optic disc swelling and possibility of benign intracranial hypertension, discussed risk factors, treatment options and answered questions. I recommend he start taking Diamox 125 mg twice daily and have discussed possible side effects with him and may consider increasing dose later. He was advised to avoid excessive weight gain or weight loss and short period of time and to avoid taking excessive over-the-counter supplements and vitamins. We discussed the need for doing diagnostic spinal tap to confirm the diagnosis the patient is reluctant to do that at the present time. Check MRI scan of the brain and orbits with and without contrast, ESR, ANA, Lyme titer, angiotensin-converting enzyme, CMP and CBC. Referred to ophthalmologist for visual field blind spot charting. He'll return for follow-up in 2 months or call earlier if necessary. Greater than 50% time during this 45 minute consultation was spent on counseling and coordination of care about his optic disc edema and treatment options Antony Contras, MD  [facility] [address] [place],  [place] [address]  Phone [phone] Fax [phone] Note: This document was prepared with digital dictation and possible smart phrase technology. Any transcriptional errors that result from this process are unintentional.

## 2016-05-17 NOTE — Patient Instructions (Signed)
I had a long discussion with the patient with regards to his optic disc swelling and possibility of benign intracranial hypertension, discussed risk factors, treatment options and answered questions. I recommend he start taking Diamox 125 mg twice daily and have discussed possible side effects with him and may consider increasing dose later. He was advised to avoid excessive weight gain or weight loss and short period of time and to avoid taking excessive over-the-counter supplements and vitamins. Check MRI scan of the brain and orbits with and without contrast, ESR, ANA, Lyme titer, angiotensin-converting enzyme, CMP and CBC. Referred to ophthalmologist for visual field blind spot charting. He'll return for follow-up in 2 months or call earlier if necessary Idiopathic Intracranial Hypertension Idiopathic intracranial hypertension (IIH) is a neurologic disorder that leads to increased pressure around your brain. It can cause vision loss and blindness if left untreated. RISK FACTORS IIH is most common in very overweight (obese) women of childbearing age. SIGNS AND SYMPTOMS  Symptoms of IIH include:  Headache.  Feeling of sickness in your stomach (nausea).  Vomiting.  A "rushing of water" sound within your ears (pulsatile tinnitus).  Double vision. DIAGNOSIS  Idiopathic intracranial hypertension is diagnosed with the aid of different exams:  Brain scans such as:  CT.  MRI.  MRV.  Diagnostic lumbar puncture. This procedure can determine if there is too much spinal fluid within the central nervous system. Too much spinal fluid can increase intracranial pressure.  A thorough eye exam will be done to look for swelling within the eyes. Visual field testing will also be done to see if any damage has occurred to nerves in the eyes. TREATMENT  Treatment of idiopathic intracranial hypertension is based on symptoms. Common treatments include:  Lumbar puncture to remove excess spinal  fluid.  Medicine.  Surgery. HOME CARE INSTRUCTIONS The most important thing anyone can do to improve this condition is lose weight if they are overweight.  SEEK MEDICAL CARE IF:  You have changes in vision.  You have double vision.  You have loss of color vision. SEEK IMMEDIATE MEDICAL CARE IF:   Your headaches get worse rather than better.  Nausea or vomiting or both continue after treatment.  Your vision does not improve or gets worse after treatment. MAKE SURE YOU:  Understand these instructions.  Will watch your condition.  Will get help right away if you are not doing well or get worse.   This information is not intended to replace advice given to you by your health care provider. Make sure you discuss any questions you have with your health care provider.   Document Released: 12/23/2001 Document Revised: 10/19/2013 Document Reviewed: 06/21/2013 Elsevier Interactive Patient Education [employer].

## 2016-05-20 LAB — CBC
HEMATOCRIT: 40.6 % (ref 37.5–51.0)
Hemoglobin: 13.3 g/dL (ref 12.6–17.7)
MCH: 30.8 pg (ref 26.6–33.0)
MCHC: 32.8 g/dL (ref 31.5–35.7)
MCV: 94 fL (ref 79–97)
PLATELETS: 237 10*3/uL (ref 150–379)
RBC: 4.32 x10E6/uL (ref 4.14–5.80)
RDW: 14.9 % (ref 12.3–15.4)
WBC: 10.2 10*3/uL (ref 3.4–10.8)

## 2016-05-20 LAB — COMPREHENSIVE METABOLIC PANEL
A/G RATIO: 1.5 (ref 1.2–2.2)
ALK PHOS: 50 IU/L (ref 39–117)
ALT: 26 IU/L (ref 0–44)
AST: 30 IU/L (ref 0–40)
Albumin: 4.2 g/dL (ref 3.5–5.5)
BUN / CREAT RATIO: 12 (ref 9–20)
BUN: 16 mg/dL (ref 6–24)
Bilirubin Total: 0.3 mg/dL (ref 0.0–1.2)
CHLORIDE: 101 mmol/L (ref 96–106)
CO2: 26 mmol/L (ref 18–29)
Calcium: 9.5 mg/dL (ref 8.7–10.2)
Creatinine, Ser: 1.32 mg/dL — ABNORMAL HIGH (ref 0.76–1.27)
GFR calc Af Amer: 73 mL/min/{1.73_m2} (ref >59)
GFR calc non Af Amer: 63 mL/min/{1.73_m2} (ref >59)
GLUCOSE: 83 mg/dL (ref 65–99)
Globulin, Total: 2.8 g/dL (ref 1.5–4.5)
POTASSIUM: 5.1 mmol/L (ref 3.5–5.2)
SODIUM: 143 mmol/L (ref 134–144)
Total Protein: 7 g/dL (ref 6.0–8.5)

## 2016-05-20 LAB — SEDIMENTATION RATE: SED RATE: 2 mm/h (ref 0–15)

## 2016-05-20 LAB — B. BURGDORFI ANTIBODIES: Lyme IgG/IgM Ab: <0.91 {ISR} (ref 0.00–0.90)

## 2016-05-20 LAB — ANGIOTENSIN CONVERTING ENZYME: ANGIO CONVERT ENZYME: 23 U/L (ref 14–82)

## 2016-05-20 LAB — ANA: Anti Nuclear Antibody(ANA): NEGATIVE

## 2016-06-07 ENCOUNTER — Other Ambulatory Visit: Payer: Self-pay

## 2016-06-07 ENCOUNTER — Telehealth: Payer: Self-pay | Admitting: Neurology

## 2016-06-07 MED ORDER — ALPRAZOLAM 0.5 MG PO TABS
0.5000 mg | ORAL_TABLET | Freq: Every evening | ORAL | 0 refills | Status: DC | PRN
Start: 2016-06-07 — End: 2016-06-07

## 2016-06-07 MED ORDER — ALPRAZOLAM 0.5 MG PO TABS: 0.5000 mg | ORAL_TABLET | Freq: Every evening | ORAL | 0 refills | Status: DC | PRN

## 2016-06-07 NOTE — Telephone Encounter (Signed)
XANAX prescription done by Dr.Xu. Pt having MRI next week. The directions on the prescription for pt to have a driver when taking this medication. Fax to patients pharmacy.

## 2016-06-07 NOTE — Telephone Encounter (Signed)
Patient called to check status of [facility] [place] for MRI scheduled for Wednesday August 16th.

## 2016-06-10 NOTE — Telephone Encounter (Signed)
Rn call patient that the xanax was [facility] to [facility] in [place]. Rn explain to patient someone has to drive him to and from the MRI testing. Rn explain the medication causes drowsiness. PT verbalized understand.

## 2016-06-12 ENCOUNTER — Ambulatory Visit (INDEPENDENT_AMBULATORY_CARE_PROVIDER_SITE_OTHER): Payer: BLUE CROSS/BLUE SHIELD

## 2016-06-12 DIAGNOSIS — G932 Benign intracranial hypertension: Secondary | ICD-10-CM | POA: Diagnosis not present

## 2016-06-12 DIAGNOSIS — H4710 Unspecified papilledema: Secondary | ICD-10-CM | POA: Diagnosis not present

## 2016-06-13 MED ORDER — GADOPENTETATE DIMEGLUMINE 469.01 MG/ML IV SOLN: 12.0000 mL | Freq: Once | INTRAVENOUS | Status: AC | PRN

## 2016-06-26 DIAGNOSIS — H47321 Drusen of optic disc, right eye: Secondary | ICD-10-CM | POA: Diagnosis not present

## 2016-06-26 DIAGNOSIS — H47322 Drusen of optic disc, left eye: Secondary | ICD-10-CM | POA: Diagnosis not present

## 2016-07-24 DIAGNOSIS — H47321 Drusen of optic disc, right eye: Secondary | ICD-10-CM | POA: Diagnosis not present

## 2016-08-08 ENCOUNTER — Encounter: Payer: Self-pay | Admitting: Neurology

## 2016-08-08 ENCOUNTER — Ambulatory Visit (INDEPENDENT_AMBULATORY_CARE_PROVIDER_SITE_OTHER): Payer: BLUE CROSS/BLUE SHIELD | Admitting: Neurology

## 2016-08-08 ENCOUNTER — Telehealth: Payer: Self-pay

## 2016-08-08 DIAGNOSIS — G932 Benign intracranial hypertension: Secondary | ICD-10-CM | POA: Diagnosis not present

## 2016-08-08 MED ORDER — ACETAZOLAMIDE 125 MG PO TABS
250.0000 mg | ORAL_TABLET | Freq: Two times a day (BID) | ORAL | 2 refills | Status: AC
Start: 2016-08-08 — End: ?

## 2016-08-08 NOTE — Patient Instructions (Signed)
I had a long discussion with the patient with regards to his benign intracranial hypertension and reviewed MRI scan of the brain and orbit results with him. I recommend he increase Diamox dose to 250 mg twice daily. I again recommend he do a diagnostic spinal tap but he appears reluctant. He will discuss this with the family and let me know. Have advised him to avoid significant weight fluctuation and over-the-counter medications. He will continue follow-up with ophthalmologist for vision. Blind spot charting. I advised him to call me if he starts having headaches or significant worsening of his vision. Return for follow-up with me in 3 months or call earlier if necessary  Idiopathic Intracranial Hypertension Idiopathic intracranial hypertension (IIH) is a neurologic disorder that leads to increased pressure around your brain. It can cause vision loss and blindness if left untreated. RISK FACTORS IIH is most common in very overweight (obese) women of childbearing age. SIGNS AND SYMPTOMS  Symptoms of IIH include:  Headache.  Feeling of sickness in your stomach (nausea).  Vomiting.  A "rushing of water" sound within your ears (pulsatile tinnitus).  Double vision. DIAGNOSIS  Idiopathic intracranial hypertension is diagnosed with the aid of different exams:  Brain scans such as:  CT.  MRI.  MRV.  Diagnostic lumbar puncture. This procedure can determine if there is too much spinal fluid within the central nervous system. Too much spinal fluid can increase intracranial pressure.  A thorough eye exam will be done to look for swelling within the eyes. Visual field testing will also be done to see if any damage has occurred to nerves in the eyes. TREATMENT  Treatment of idiopathic intracranial hypertension is based on symptoms. Common treatments include:  Lumbar puncture to remove excess spinal fluid.  Medicine.  Surgery. HOME CARE INSTRUCTIONS The most important thing anyone can do  to improve this condition is lose weight if they are overweight.  SEEK MEDICAL CARE IF:  You have changes in vision.  You have double vision.  You have loss of color vision. SEEK IMMEDIATE MEDICAL CARE IF:   Your headaches get worse rather than better.  Nausea or vomiting or both continue after treatment.  Your vision does not improve or gets worse after treatment. MAKE SURE YOU:  Understand these instructions.  Will watch your condition.  Will get help right away if you are not doing well or get worse.   This information is not intended to replace advice given to you by your health care provider. Make sure you discuss any questions you have with your health care provider.   Document Released: 12/23/2001 Document Revised: 10/19/2013 Document Reviewed: 06/21/2013 Elsevier Interactive Patient Education [employer] Elsevier Inc.

## 2016-08-08 NOTE — Telephone Encounter (Signed)
Patient was in for a follow up appointment. Pt [facility] by Dr. Pearlean BrownieSethi. [facility] or Dr.Sethi did not get office notes from the MD. Rn calll [phone] to get all office notes on patient.Rn spoke with April at office and gave fax of [phone] for all office notes.

## 2016-08-08 NOTE — Progress Notes (Addendum)
[facility] [address] [place]. [place] [address] [phone]       OFFICE FOLLOW UP VISIT NOTE  Mr. David Mann Date of Birth:  10/19/1966 Medical Record Number:  [id number]   Referring MD:  Anthony Sar, OD Reason for Referral:  Optic disc swelling  HPI: Initial Consult 05/17/16 :David Mann is a 50 year old pleasant Caucasian male who was recently found to have optic disc swelling on funduscopic exam by optometrist when he had gone for a routine eye exam. He had been having some difficulty reading for more than a year. He had a previous eye exam done by different optometrist who had not noticed any optic disc edema however the glasses that he had prescribed were not working since patient went to see a new optometrist this time. Patient denies any ongoing headaches or loss of vision. He denies any double vision, vertigo, nausea, gait or balance problems. Denies any recent rashes, tick bites or travel outside the country. He admits to  Barely 2 or  3  headache episodes over the last several years which sound like migraines. He does have family history of migraine his sister. Patient does admit to having intentionally lost about 35 pounds over the last year or so by cutting out mainly pseudomonas and junk food. He has not had any recent lab work, brain imaging done. He denies any seizures, loss of consciousness, significant head injury. She does not take excess vitamins. Update 08/08/2016 : He returns for follow-up after last visit 3 months ago. He states he continues to have vision difficulty which is however unchanged. He has been seen twice by Dr. Irving Shows at [facility] ophthalmology and had blind spot charting done however I have not received those notes. Patient denies any headaches. He states his weight has been constant and has not gained or lost significant weight. He is tolerating Diamox 125 mg twice daily quite well without side effects. He did have some minor tingling in the  beginning which was transient. He had lab work done on 04/2116 which included ESR, ANA titer, Lyme titer, angiotensin-converting enzyme, Cat scratch antibodies all of which were normal. MRI scan of the brain done on 06/12/16 was normal and MRI scan of the orbit showed slight dilatation of the optic nerve sheaths which is a nonspecific finding but no other structural abnormalities. Patient is still reluctant to consider doing a diagnostic spinal tap and is thinking about it and let me know if he changes his mind. ROS:   14 system review of systems is positive for blurred vision, ringing in the ears, joint pain, aching muscles, allergies, memory loss, headache and all other systems negative  PMH:  Past Medical History:  Diagnosis Date  . Arthritis   . Asthma   . Factor 5 Leiden mutation, heterozygous ([place])   . GERD (gastroesophageal reflux disease)    OCCASIONAL  . Medial meniscus tear    RT    Social History:  Social History   Social History  . Marital status: Married    Spouse name: N/A  . Number of children: N/A  . Years of education: N/A   Occupational History  . Not on file.   Social History Main Topics  . Smoking status: Never Smoker  . Smokeless tobacco: Never Used  . Alcohol use 0.6 oz/week    1 Cans of beer per week     Comment: OCCASIONAL  . Drug use: No  . Sexual activity: Not on file   Other Topics  Concern  . Not on file   Social History Narrative  . No narrative on file    Medications:   Current Outpatient Prescriptions on File Prior to Visit  Medication Sig Dispense Refill  . albuterol (PROVENTIL HFA;VENTOLIN HFA) 108 (90 BASE) MCG/ACT inhaler Inhale 2 puffs into the lungs every 6 (six) hours as needed for wheezing or shortness of breath.    . cetirizine (ZYRTEC) 10 MG tablet Take by mouth.    . fluticasone (FLONASE) 50 MCG/ACT nasal spray Place 2 sprays into both nostrils daily.    . Fluticasone-Salmeterol (ADVAIR DISKUS) 250-50 MCG/DOSE AEPB Inhale into  the lungs.    Javier Docker Oil 300 MG CAPS Take by mouth.    . metoprolol succinate (TOPROL-XL) 50 MG 24 hr tablet TAKE ONE TABLET BY MOUTH EVERY MORNING FOR BLOOD PRESSURE  11  . montelukast (SINGULAIR) 10 MG tablet Take 10 mg by mouth at bedtime.    . Multiple Vitamins-Minerals (ONE-A-DAY MENS HEALTH FORMULA) TABS Take by mouth.    . naproxen sodium (ALEVE) 220 MG tablet Take by mouth.     Current Facility-Administered Medications on File Prior to Visit  Medication Dose Route Frequency Provider Last Rate Last Dose  . gadopentetate dimeglumine (MAGNEVIST) injection 12 mL  12 mL Intravenous Once PRN Garvin Fila, MD        Allergies:  No Known Allergies  Physical Exam General:Mildly obese young Caucasian male, seated, in no evident distress Head: head normocephalic and atraumatic.   Neck: supple with no carotid or supraclavicular bruits Cardiovascular: regular rate and rhythm, no murmurs Musculoskeletal: no deformity Skin:  no rash/petichiae Vascular:  Normal pulses all extremities  Neurologic Exam Mental Status: Awake and fully alert. Oriented to place and time. Recent and remote memory intact. Attention span, concentration and fund of knowledge appropriate. Mood and affect appropriate.  Cranial Nerves: Fundoscopic exam reveals s shows mild bilateral papilledema with absent venous pulsations. There are no peripapillary hemorrhages or exudates noted. Vision acuity is normal bilaterally and visual fields are full to bedside confrontational testing. There is no afferent pupillary defect.. Pupils equal, briskly reactive to light. Extraocular movements full without nystagmus.  Hearing intact. Facial sensation intact. Face, tongue, palate moves normally and symmetrically.  Motor: Normal bulk and tone. Normal strength in all tested extremity muscles. Sensory.: intact to touch , pinprick , position and vibratory sensation.  Coordination: Rapid alternating movements normal in all extremities.  Finger-to-nose and heel-to-shin performed accurately bilaterally. Gait and Station: Arises from chair without difficulty. Stance is normal. Gait demonstrates normal stride length and balance . Able to heel, toe and tandem walk without difficulty.  Reflexes: 1+ and symmetric. Toes downgoing.       ASSESSMENT: 50 year old Caucasian male with mild bilateral optic disc edema  Felt likely due to benign intracranial hypertension. Risk factors likely sick significant recent weight loss. Denies significant headaches or vision difficulties. Dr. 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 I recommend he taper and discontinue Diamox  y. I again recommend he do a diagnostic spinal tap but he appears reluctant. He will discuss this with the family and let me know. Have advised him to avoid significant weight fluctuation and over-the-counter medications. He will continue follow-up with ophthalmologist for vision. Blind spot charting. I advised him to  call me if he starts having headaches or significant worsening of his vision. Greater than 50% time during this 25 minute office visit was spent on counseling and coordination of care about benign intracranial hypertension, vision loss and need for spinal tap .Return for follow-up with me only as necessary call earlier if necessary Antony Contras, MD  [facility] [address] [place], [place] [address] Addendum. After the patient had left I received patient's office consultation notes from Dr. Josephina Shih from [facility] ophthalmology. His note for this he thinks patient is suitable papilledema and not true papilledema. Phone [phone] Fax [phone] Note: This document was prepared with digital dictation and  possible smart phrase technology. Any transcriptional errors that result from this process are unintentional.  Addendum : After the above visit had already been completed and calling Dr. Katy Apo [facility]  I received a fax from his consultation note and his impression was patient had pseudopapilledema and not true papilledema. [name] previous consultation note had inadvertently been sent to Dr. Richrd Sox and not to me. Hence I have changed my recommendation for increasing Diamox and recommend he instead taper and stop Diamox. He will continue follow-up with ophthalmologist for stability of his pseudopapilledema and return for follow-up with me only if necessary Spoke to the patient over the phone later this afternoon and explained above things to him and answered questions. He voiced understanding. Antony Contras, MD

## 2016-08-12 NOTE — Telephone Encounter (Signed)
Late entry. Dr. Pearlean BrownieSethi did receive eye report for patient. The original report was sent to the wrong MD. Dr.Sethi did call the pt and reviewed I on 08/08/2016.

## 2016-11-11 ENCOUNTER — Ambulatory Visit: Payer: BLUE CROSS/BLUE SHIELD | Admitting: Neurology

## 2016-11-27 DIAGNOSIS — H47321 Drusen of optic disc, right eye: Secondary | ICD-10-CM | POA: Diagnosis not present

## 2017-07-28 DIAGNOSIS — H47321 Drusen of optic disc, right eye: Secondary | ICD-10-CM | POA: Diagnosis not present

## 2017-07-28 DIAGNOSIS — H5203 Hypermetropia, bilateral: Secondary | ICD-10-CM | POA: Diagnosis not present

## 2017-10-09 DIAGNOSIS — I10 Essential (primary) hypertension: Secondary | ICD-10-CM | POA: Diagnosis not present

## 2017-10-09 DIAGNOSIS — J4540 Moderate persistent asthma, uncomplicated: Secondary | ICD-10-CM | POA: Diagnosis not present

## 2017-10-09 DIAGNOSIS — D6851 Activated protein C resistance: Secondary | ICD-10-CM | POA: Diagnosis not present

## 2017-10-09 DIAGNOSIS — M170 Bilateral primary osteoarthritis of knee: Secondary | ICD-10-CM | POA: Diagnosis not present

## 2017-10-13 DIAGNOSIS — M7541 Impingement syndrome of right shoulder: Secondary | ICD-10-CM | POA: Diagnosis not present

## 2018-04-16 DIAGNOSIS — Z0000 Encounter for general adult medical examination without abnormal findings: Secondary | ICD-10-CM | POA: Diagnosis not present

## 2018-04-16 DIAGNOSIS — Z23 Encounter for immunization: Secondary | ICD-10-CM | POA: Diagnosis not present

## 2018-04-16 DIAGNOSIS — I10 Essential (primary) hypertension: Secondary | ICD-10-CM | POA: Diagnosis not present

## 2018-04-16 DIAGNOSIS — Z125 Encounter for screening for malignant neoplasm of prostate: Secondary | ICD-10-CM | POA: Diagnosis not present

## 2018-07-28 DIAGNOSIS — Z01818 Encounter for other preprocedural examination: Secondary | ICD-10-CM | POA: Diagnosis not present

## 2018-08-28 DIAGNOSIS — Z1211 Encounter for screening for malignant neoplasm of colon: Secondary | ICD-10-CM | POA: Diagnosis not present

## 2018-10-13 DIAGNOSIS — M545 Low back pain: Secondary | ICD-10-CM | POA: Diagnosis not present

## 2018-10-13 DIAGNOSIS — M5417 Radiculopathy, lumbosacral region: Secondary | ICD-10-CM | POA: Diagnosis not present

## 2018-11-06 DIAGNOSIS — H524 Presbyopia: Secondary | ICD-10-CM | POA: Diagnosis not present

## 2018-11-06 DIAGNOSIS — H5203 Hypermetropia, bilateral: Secondary | ICD-10-CM | POA: Diagnosis not present

## 2018-11-06 DIAGNOSIS — H47321 Drusen of optic disc, right eye: Secondary | ICD-10-CM | POA: Diagnosis not present

## 2019-03-10 DIAGNOSIS — J3089 Other allergic rhinitis: Secondary | ICD-10-CM | POA: Diagnosis not present

## 2019-03-10 DIAGNOSIS — J4540 Moderate persistent asthma, uncomplicated: Secondary | ICD-10-CM | POA: Diagnosis not present

## 2019-03-10 DIAGNOSIS — D6851 Activated protein C resistance: Secondary | ICD-10-CM | POA: Diagnosis not present

## 2019-03-10 DIAGNOSIS — I10 Essential (primary) hypertension: Secondary | ICD-10-CM | POA: Diagnosis not present

## 2019-04-08 DIAGNOSIS — M545 Low back pain: Secondary | ICD-10-CM | POA: Diagnosis not present

## 2019-04-08 DIAGNOSIS — M5136 Other intervertebral disc degeneration, lumbar region: Secondary | ICD-10-CM | POA: Diagnosis not present

## 2019-04-15 DIAGNOSIS — M545 Low back pain: Secondary | ICD-10-CM | POA: Diagnosis not present

## 2019-05-04 DIAGNOSIS — M5417 Radiculopathy, lumbosacral region: Secondary | ICD-10-CM | POA: Diagnosis not present

## 2019-05-04 DIAGNOSIS — M5136 Other intervertebral disc degeneration, lumbar region: Secondary | ICD-10-CM | POA: Diagnosis not present

## 2019-05-04 DIAGNOSIS — M545 Low back pain: Secondary | ICD-10-CM | POA: Diagnosis not present

## 2019-07-08 DIAGNOSIS — M25522 Pain in left elbow: Secondary | ICD-10-CM | POA: Diagnosis not present

## 2019-09-02 DIAGNOSIS — Z0000 Encounter for general adult medical examination without abnormal findings: Secondary | ICD-10-CM | POA: Diagnosis not present

## 2019-09-07 DIAGNOSIS — I10 Essential (primary) hypertension: Secondary | ICD-10-CM | POA: Diagnosis not present

## 2019-09-07 DIAGNOSIS — Z23 Encounter for immunization: Secondary | ICD-10-CM | POA: Diagnosis not present

## 2019-09-07 DIAGNOSIS — Z125 Encounter for screening for malignant neoplasm of prostate: Secondary | ICD-10-CM | POA: Diagnosis not present

## 2019-09-07 DIAGNOSIS — E782 Mixed hyperlipidemia: Secondary | ICD-10-CM | POA: Diagnosis not present

## 2020-07-12 DIAGNOSIS — D6851 Activated protein C resistance: Secondary | ICD-10-CM | POA: Diagnosis not present

## 2020-07-12 DIAGNOSIS — J4540 Moderate persistent asthma, uncomplicated: Secondary | ICD-10-CM | POA: Diagnosis not present

## 2020-07-12 DIAGNOSIS — I10 Essential (primary) hypertension: Secondary | ICD-10-CM | POA: Diagnosis not present

## 2020-07-12 DIAGNOSIS — M25522 Pain in left elbow: Secondary | ICD-10-CM | POA: Diagnosis not present

## 2020-07-19 DIAGNOSIS — M25522 Pain in left elbow: Secondary | ICD-10-CM | POA: Diagnosis not present

## 2020-07-19 DIAGNOSIS — I10 Essential (primary) hypertension: Secondary | ICD-10-CM | POA: Diagnosis not present

## 2020-09-08 ENCOUNTER — Other Ambulatory Visit: Payer: Self-pay | Admitting: Family

## 2020-09-08 ENCOUNTER — Telehealth (HOSPITAL_COMMUNITY): Payer: Self-pay

## 2020-09-08 DIAGNOSIS — R059 Cough, unspecified: Secondary | ICD-10-CM | POA: Diagnosis not present

## 2020-09-08 DIAGNOSIS — R0602 Shortness of breath: Secondary | ICD-10-CM | POA: Diagnosis not present

## 2020-09-08 DIAGNOSIS — R079 Chest pain, unspecified: Secondary | ICD-10-CM | POA: Diagnosis not present

## 2020-09-08 DIAGNOSIS — U071 COVID-19: Secondary | ICD-10-CM | POA: Diagnosis not present

## 2020-09-08 NOTE — Progress Notes (Signed)
I connected by phone with David Mann on 09/08/2020 at 6:01 PM to discuss the potential use of a new treatment for mild to moderate COVID-19 viral infection in non-hospitalized patients.  This patient is a 54 y.o. male that meets the FDA criteria for Emergency Use Authorization of COVID monoclonal antibody casirivimab/imdevimab, bamlanivimab/eteseviamb, or sotrovimab.  Has a (+) direct SARS-CoV-2 viral test result  Has mild or moderate COVID-19   Is NOT hospitalized due to COVID-19  Is within 10 days of symptom onset  Has at least one of the high risk factor(s) for progression to severe COVID-19 and/or hospitalization as defined in EUA.  Specific high risk criteria : Cardiovascular disease or hypertension and Chronic Lung Disease   Symptoms of H/A chills, cough, rib pain began 09/07/20.   I have spoken and communicated the following to the patient or parent/caregiver regarding COVID monoclonal antibody treatment:  1. FDA has authorized the emergency use for the treatment of mild to moderate COVID-19 in adults and pediatric patients with positive results of direct SARS-CoV-2 viral testing who are 45 years of age and older weighing at least 40 kg, and who are at high risk for progressing to severe COVID-19 and/or hospitalization.  2. The significant known and potential risks and benefits of COVID monoclonal antibody, and the extent to which such potential risks and benefits are unknown.  3. Information on available alternative treatments and the risks and benefits of those alternatives, including clinical trials.  4. Patients treated with COVID monoclonal antibody should continue to self-isolate and use infection control measures (e.g., wear mask, isolate, social distance, avoid sharing personal items, clean and disinfect high touch surfaces, and frequent handwashing) according to CDC guidelines.   5. The patient or parent/caregiver has the option to accept or refuse COVID monoclonal  antibody treatment.  After reviewing this information with the patient, the patient has agreed to receive one of the available covid 19 monoclonal antibodies and will be provided an appropriate fact sheet prior to infusion. Morton Stall, NP 09/08/2020 6:01 PM

## 2020-09-08 NOTE — Telephone Encounter (Signed)
Called to Discuss with patient about Covid symptoms and the use of the monoclonal antibody infusion for those with mild to moderate Covid symptoms and at a high risk of hospitalization.     Pt appears to qualify for this infusion due to co-morbid conditions and/or a member of an at-risk group in accordance with the FDA Emergency Use Authorization.    Risk factors include asthma and hypertension.  Pt with sx onset 09/07/20. At home test positive 09/07/20.  Pt pre-screened by RN and ready for APP to call and further discuss and/or schedule appt.  Pt states he will call insurance to verify how much his insurance will cover for this procedure.

## 2020-09-09 ENCOUNTER — Ambulatory Visit (HOSPITAL_COMMUNITY)
Admission: RE | Admit: 2020-09-09 | Discharge: 2020-09-09 | Disposition: A | Discharge status: Home / Self Care | Payer: BC Managed Care – PPO | Source: Ambulatory Visit | Attending: Pulmonary Disease | Admitting: Pulmonary Disease

## 2020-09-09 ENCOUNTER — Other Ambulatory Visit: Payer: Self-pay

## 2020-09-09 ENCOUNTER — Other Ambulatory Visit (HOSPITAL_COMMUNITY): Payer: Self-pay

## 2020-09-09 DIAGNOSIS — U071 COVID-19: Secondary | ICD-10-CM | POA: Insufficient documentation

## 2020-09-09 MED ORDER — ALBUTEROL SULFATE HFA 108 (90 BASE) MCG/ACT IN AERS: 2.0000 | INHALATION_SPRAY | Freq: Once | RESPIRATORY_TRACT | Status: DC | PRN

## 2020-09-09 MED ORDER — FAMOTIDINE IN NACL 20-0.9 MG/50ML-% IV SOLN: 20.0000 mg | Freq: Once | INTRAVENOUS | Status: DC | PRN

## 2020-09-09 MED ORDER — DIPHENHYDRAMINE HCL 50 MG/ML IJ SOLN: 50.0000 mg | Freq: Once | INTRAMUSCULAR | Status: DC | PRN

## 2020-09-09 MED ORDER — SODIUM CHLORIDE 0.9 % IV SOLN: INTRAVENOUS | Status: DC | PRN

## 2020-09-09 MED ORDER — METHYLPREDNISOLONE SODIUM SUCC 125 MG IJ SOLR: 125.0000 mg | Freq: Once | INTRAMUSCULAR | Status: DC | PRN

## 2020-09-09 MED ORDER — EPINEPHRINE 0.3 MG/0.3ML IJ SOAJ: 0.3000 mg | Freq: Once | INTRAMUSCULAR | Status: DC | PRN

## 2020-09-09 MED ORDER — SOTROVIMAB 500 MG/8ML IV SOLN
500.0000 mg | Freq: Once | INTRAVENOUS | Status: AC
  Administered 2020-09-09: 500 mg via INTRAVENOUS

## 2020-09-09 NOTE — Progress Notes (Signed)
Diagnosis: COVID-19  Physician: Dr. Patrick Wright  Procedure: Covid Infusion Clinic Med: Sotrovimab infusion - Provided patient with sotrovimab fact sheet for patients, parents, and caregivers prior to infusion.   Complications: No immediate complications noted  Discharge: Discharged home    

## 2020-09-09 NOTE — Discharge Instructions (Signed)
10 Things You Can Do to Manage Your COVID-19 Symptoms at Home If you have possible or confirmed COVID-19: 1. Stay home from work and school. And stay away from other public places. If you must go out, avoid using any kind of public transportation, ridesharing, or taxis. 2. Monitor your symptoms carefully. If your symptoms get worse, call your healthcare provider immediately. 3. Get rest and stay hydrated. 4. If you have a medical appointment, call the healthcare provider ahead of time and tell them that you have or may have COVID-19. 5. For medical emergencies, call 911 and notify the dispatch personnel that you have or may have COVID-19. 6. Cover your cough and sneezes with a tissue or use the inside of your elbow. 7. Wash your hands often with soap and water for at least 20 seconds or clean your hands with an alcohol-based hand sanitizer that contains at least 60% alcohol. 8. As much as possible, stay in a specific room and away from other people in your home. Also, you should use a separate bathroom, if available. If you need to be around other people in or outside of the home, wear a mask. 9. Avoid sharing personal items with other people in your household, like dishes, towels, and bedding. 10. Clean all surfaces that are touched often, like counters, tabletops, and doorknobs. Use household cleaning sprays or wipes according to the label instructions. cdc.gov/coronavirus 04/28/2019 This information is not intended to replace advice given to you by your health care provider. Make sure you discuss any questions you have with your health care provider. Document Revised: 09/30/2019 Document Reviewed: 09/30/2019 Elsevier Patient Education  2020 [employer]   COVID-19 COVID-19 is a respiratory infection that is caused by a virus called severe acute respiratory syndrome coronavirus 2 (SARS-CoV-2). The disease is also known as coronavirus disease or novel coronavirus. In some people, the virus may  not cause any symptoms. In others, it may cause a serious infection. The infection can get worse quickly and can lead to complications, such as:  Pneumonia, or infection of the lungs.  Acute respiratory distress syndrome or ARDS. This is a condition in which fluid build-up in the lungs prevents the lungs from filling with air and passing oxygen into the blood.  Acute respiratory failure. This is a condition in which there is not enough oxygen passing from the lungs to the body or when carbon dioxide is not passing from the lungs out of the body.  Sepsis or septic shock. This is a serious bodily reaction to an infection.  Blood clotting problems.  Secondary infections due to bacteria or fungus.  Organ failure. This is when your body's organs stop working. The virus that causes COVID-19 is contagious. This means that it can spread from person to person through droplets from coughs and sneezes (respiratory secretions). What are the causes? This illness is caused by a virus. You may catch the virus by:  Breathing in droplets from an infected person. Droplets can be spread by a person breathing, speaking, singing, coughing, or sneezing.  Touching something, like a table or a doorknob, that was exposed to the virus (contaminated) and then touching your mouth, nose, or eyes. What increases the risk? Risk for infection You are more likely to be infected with this virus if you:  Are within 6 feet (2 meters) of a person with COVID-19.  Provide care for or live with a person who is infected with COVID-19.  Spend time in crowded indoor spaces or   live in shared housing. Risk for serious illness You are more likely to become seriously ill from the virus if you:  Are 50 years of age or older. The higher your age, the more you are at risk for serious illness.  Live in a nursing home or long-term care facility.  Have cancer.  Have a long-term (chronic) disease such as: ? Chronic lung disease,  including chronic obstructive pulmonary disease or asthma. ? A long-term disease that lowers your body's ability to fight infection (immunocompromised). ? Heart disease, including heart failure, a condition in which the arteries that lead to the heart become narrow or blocked (coronary artery disease), a disease which makes the heart muscle thick, weak, or stiff (cardiomyopathy). ? Diabetes. ? Chronic kidney disease. ? Sickle cell disease, a condition in which red blood cells have an abnormal "sickle" shape. ? Liver disease.  Are obese. What are the signs or symptoms? Symptoms of this condition can range from mild to severe. Symptoms may appear any time from 2 to 14 days after being exposed to the virus. They include:  A fever or chills.  A cough.  Difficulty breathing.  Headaches, body aches, or muscle aches.  Runny or stuffy (congested) nose.  A sore throat.  New loss of taste or smell. Some people may also have stomach problems, such as nausea, vomiting, or diarrhea. Other people may not have any symptoms of COVID-19. How is this diagnosed? This condition may be diagnosed based on:  Your signs and symptoms, especially if: ? You live in an area with a COVID-19 outbreak. ? You recently traveled to or from an area where the virus is common. ? You provide care for or live with a person who was diagnosed with COVID-19. ? You were exposed to a person who was diagnosed with COVID-19.  A physical exam.  Lab tests, which may include: ? Taking a sample of fluid from the back of your nose and throat (nasopharyngeal fluid), your nose, or your throat using a swab. ? A sample of mucus from your lungs (sputum). ? Blood tests.  Imaging tests, which may include, X-rays, CT scan, or ultrasound. How is this treated? At present, there is no medicine to treat COVID-19. Medicines that treat other diseases are being used on a trial basis to see if they are effective against COVID-19. Your  health care provider will talk with you about ways to treat your symptoms. For most people, the infection is mild and can be managed at home with rest, fluids, and over-the-counter medicines. Treatment for a serious infection usually takes places in a hospital intensive care unit (ICU). It may include one or more of the following treatments. These treatments are given until your symptoms improve.  Receiving fluids and medicines through an IV.  Supplemental oxygen. Extra oxygen is given through a tube in the nose, a face mask, or a hood.  Positioning you to lie on your stomach (prone position). This makes it easier for oxygen to get into the lungs.  Continuous positive airway pressure (CPAP) or bi-level positive airway pressure (BPAP) machine. This treatment uses mild air pressure to keep the airways open. A tube that is connected to a motor delivers oxygen to the body.  Ventilator. This treatment moves air into and out of the lungs by using a tube that is placed in your windpipe.  Tracheostomy. This is a procedure to create a hole in the neck so that a breathing tube can be inserted.  Extracorporeal membrane   oxygenation (ECMO). This procedure gives the lungs a chance to recover by taking over the functions of the heart and lungs. It supplies oxygen to the body and removes carbon dioxide. Follow these instructions at home: Lifestyle  If you are sick, stay home except to get medical care. Your health care provider will tell you how long to stay home. Call your health care provider before you go for medical care.  Rest at home as told by your health care provider.  Do not use any products that contain nicotine or tobacco, such as cigarettes, e-cigarettes, and chewing tobacco. If you need help quitting, ask your health care provider.  Return to your normal activities as told by your health care provider. Ask your health care provider what activities are safe for you. General  instructions  Take over-the-counter and prescription medicines only as told by your health care provider.  Drink enough fluid to keep your urine pale yellow.  Keep all follow-up visits as told by your health care provider. This is important. How is this prevented?  There is no vaccine to help prevent COVID-19 infection. However, there are steps you can take to protect yourself and others from this virus. To protect yourself:   Do not travel to areas where COVID-19 is a risk. The areas where COVID-19 is reported change often. To identify high-risk areas and travel restrictions, check the CDC travel website: wwwnc.cdc.gov/travel/notices  If you live in, or must travel to, an area where COVID-19 is a risk, take precautions to avoid infection. ? Stay away from people who are sick. ? Wash your hands often with soap and water for 20 seconds. If soap and water are not available, use an alcohol-based hand sanitizer. ? Avoid touching your mouth, face, eyes, or nose. ? Avoid going out in public, follow guidance from your state and local health authorities. ? If you must go out in public, wear a cloth face covering or face mask. Make sure your mask covers your nose and mouth. ? Avoid crowded indoor spaces. Stay at least 6 feet (2 meters) away from others. ? Disinfect objects and surfaces that are frequently touched every day. This may include:  Counters and tables.  Doorknobs and light switches.  Sinks and faucets.  Electronics, such as phones, remote controls, keyboards, computers, and tablets. To protect others: If you have symptoms of COVID-19, take steps to prevent the virus from spreading to others.  If you think you have a COVID-19 infection, contact your health care provider right away. Tell your health care team that you think you may have a COVID-19 infection.  Stay home. Leave your house only to seek medical care. Do not use public transport.  Do not travel while you are  sick.  Wash your hands often with soap and water for 20 seconds. If soap and water are not available, use alcohol-based hand sanitizer.  Stay away from other members of your household. Let healthy household members care for children and pets, if possible. If you have to care for children or pets, wash your hands often and wear a mask. If possible, stay in your own room, separate from others. Use a different bathroom.  Make sure that all people in your household wash their hands well and often.  Cough or sneeze into a tissue or your sleeve or elbow. Do not cough or sneeze into your hand or into the air.  Wear a cloth face covering or face mask. Make sure your mask covers your nose   and mouth. Where to find more information  Centers for Disease Control and Prevention: www.cdc.gov/coronavirus/2019-ncov/index.html  World Health Organization: www.who.int/health-topics/coronavirus Contact a health care provider if:  You live in or have traveled to an area where COVID-19 is a risk and you have symptoms of the infection.  You have had contact with someone who has COVID-19 and you have symptoms of the infection. Get help right away if:  You have trouble breathing.  You have pain or pressure in your chest.  You have confusion.  You have bluish lips and fingernails.  You have difficulty waking from sleep.  You have symptoms that get worse. These symptoms may represent a serious problem that is an emergency. Do not wait to see if the symptoms will go away. Get medical help right away. Call your local emergency services (911 in the [place] Do not drive yourself to the hospital. Let the emergency medical personnel know if you think you have COVID-19. Summary  COVID-19 is a respiratory infection that is caused by a virus. It is also known as coronavirus disease or novel coronavirus. It can cause serious infections, such as pneumonia, acute respiratory distress syndrome, acute respiratory failure,  or sepsis.  The virus that causes COVID-19 is contagious. This means that it can spread from person to person through droplets from breathing, speaking, singing, coughing, or sneezing.  You are more likely to develop a serious illness if you are 50 years of age or older, have a weak immune system, live in a nursing home, or have chronic disease.  There is no medicine to treat COVID-19. Your health care provider will talk with you about ways to treat your symptoms.  Take steps to protect yourself and others from infection. Wash your hands often and disinfect objects and surfaces that are frequently touched every day. Stay away from people who are sick and wear a mask if you are sick. This information is not intended to replace advice given to you by your health care provider. Make sure you discuss any questions you have with your health care provider. Document Revised: 08/13/2019 Document Reviewed: 11/19/2018 Elsevier Patient Education  2020 [employer]  What types of side effects do monoclonal antibody drugs cause?  Common side effects  In general, the more common side effects caused by monoclonal antibody drugs include: . Allergic reactions, such as hives or itching . Flu-like signs and symptoms, including chills, fatigue, fever, and muscle aches and pains . Nausea, vomiting . Diarrhea . Skin rashes . Low blood pressure   The CDC is recommending patients who receive monoclonal antibody treatments wait at least 90 days before being vaccinated.  Currently, there are no data on the safety and efficacy of mRNA COVID-19 vaccines in persons who received monoclonal antibodies or convalescent plasma as part of COVID-19 treatment. Based on the estimated half-life of such therapies as well as evidence suggesting that reinfection is uncommon in the 90 days after initial infection, vaccination should be deferred for at least 90 days, as a precautionary measure until additional information becomes  available, to avoid interference of the antibody treatment with vaccine-induced immune responses.  

## 2020-09-09 NOTE — Progress Notes (Signed)
  Diagnosis: COVID-19  Physician: Dr. Shan Levans   Procedure: Allergies reviewed.  Sotrovimab administered via IV infusion after med fact sheet provided to patient and all questions answered. Discharge instructions provided to patient; all questions answered.   Complications: No immediate complications noted.  Discharge: Discharged home   Gregary Signs 09/09/2020

## 2021-05-10 DIAGNOSIS — E790 Hyperuricemia without signs of inflammatory arthritis and tophaceous disease: Secondary | ICD-10-CM | POA: Diagnosis not present

## 2021-05-10 DIAGNOSIS — M25562 Pain in left knee: Secondary | ICD-10-CM | POA: Diagnosis not present

## 2021-06-07 DIAGNOSIS — Z0001 Encounter for general adult medical examination with abnormal findings: Secondary | ICD-10-CM | POA: Diagnosis not present

## 2021-06-07 DIAGNOSIS — Z125 Encounter for screening for malignant neoplasm of prostate: Secondary | ICD-10-CM | POA: Diagnosis not present

## 2021-06-07 DIAGNOSIS — I10 Essential (primary) hypertension: Secondary | ICD-10-CM | POA: Diagnosis not present

## 2021-06-07 DIAGNOSIS — E782 Mixed hyperlipidemia: Secondary | ICD-10-CM | POA: Diagnosis not present

## 2021-06-21 ENCOUNTER — Other Ambulatory Visit: Payer: Self-pay

## 2021-06-21 ENCOUNTER — Ambulatory Visit
Admission: RE | Admit: 2021-06-21 | Discharge: 2021-06-21 | Disposition: A | Discharge status: Home / Self Care | Payer: BC Managed Care – PPO | Source: Ambulatory Visit | Attending: Sports Medicine | Admitting: Sports Medicine

## 2021-06-21 ENCOUNTER — Other Ambulatory Visit: Payer: Self-pay | Admitting: Sports Medicine

## 2021-06-21 DIAGNOSIS — M25562 Pain in left knee: Secondary | ICD-10-CM | POA: Diagnosis not present

## 2021-06-21 DIAGNOSIS — M25572 Pain in left ankle and joints of left foot: Secondary | ICD-10-CM | POA: Diagnosis not present

## 2021-06-21 DIAGNOSIS — M25462 Effusion, left knee: Secondary | ICD-10-CM | POA: Diagnosis not present

## 2021-06-21 DIAGNOSIS — M1712 Unilateral primary osteoarthritis, left knee: Secondary | ICD-10-CM | POA: Diagnosis not present

## 2021-07-04 DIAGNOSIS — R0981 Nasal congestion: Secondary | ICD-10-CM | POA: Diagnosis not present

## 2021-07-04 DIAGNOSIS — R6883 Chills (without fever): Secondary | ICD-10-CM | POA: Diagnosis not present

## 2021-07-04 DIAGNOSIS — U071 COVID-19: Secondary | ICD-10-CM | POA: Diagnosis not present

## 2021-07-04 DIAGNOSIS — R519 Headache, unspecified: Secondary | ICD-10-CM | POA: Diagnosis not present

## 2022-05-17 DIAGNOSIS — H5203 Hypermetropia, bilateral: Secondary | ICD-10-CM | POA: Diagnosis not present

## 2022-05-17 DIAGNOSIS — H47321 Drusen of optic disc, right eye: Secondary | ICD-10-CM | POA: Diagnosis not present

## 2022-07-21 DIAGNOSIS — Z20822 Contact with and (suspected) exposure to covid-19: Secondary | ICD-10-CM | POA: Diagnosis not present

## 2022-08-28 DIAGNOSIS — J4540 Moderate persistent asthma, uncomplicated: Secondary | ICD-10-CM | POA: Diagnosis not present

## 2022-08-28 DIAGNOSIS — E782 Mixed hyperlipidemia: Secondary | ICD-10-CM | POA: Diagnosis not present

## 2022-08-28 DIAGNOSIS — I10 Essential (primary) hypertension: Secondary | ICD-10-CM | POA: Diagnosis not present

## 2022-10-14 DIAGNOSIS — M25572 Pain in left ankle and joints of left foot: Secondary | ICD-10-CM | POA: Diagnosis not present

## 2022-10-14 DIAGNOSIS — M25562 Pain in left knee: Secondary | ICD-10-CM | POA: Diagnosis not present

## 2022-10-27 IMAGING — CR DG KNEE 3 VIEWS*L*
3 series · 3 of 3 positions shown · non-contrast
Comparison: None.

CLINICAL DATA: Anterior knee pain for 1 month, no known injury,
initial encounter

EXAM:
LEFT KNEE - 3 VIEW

[w knee ap left]
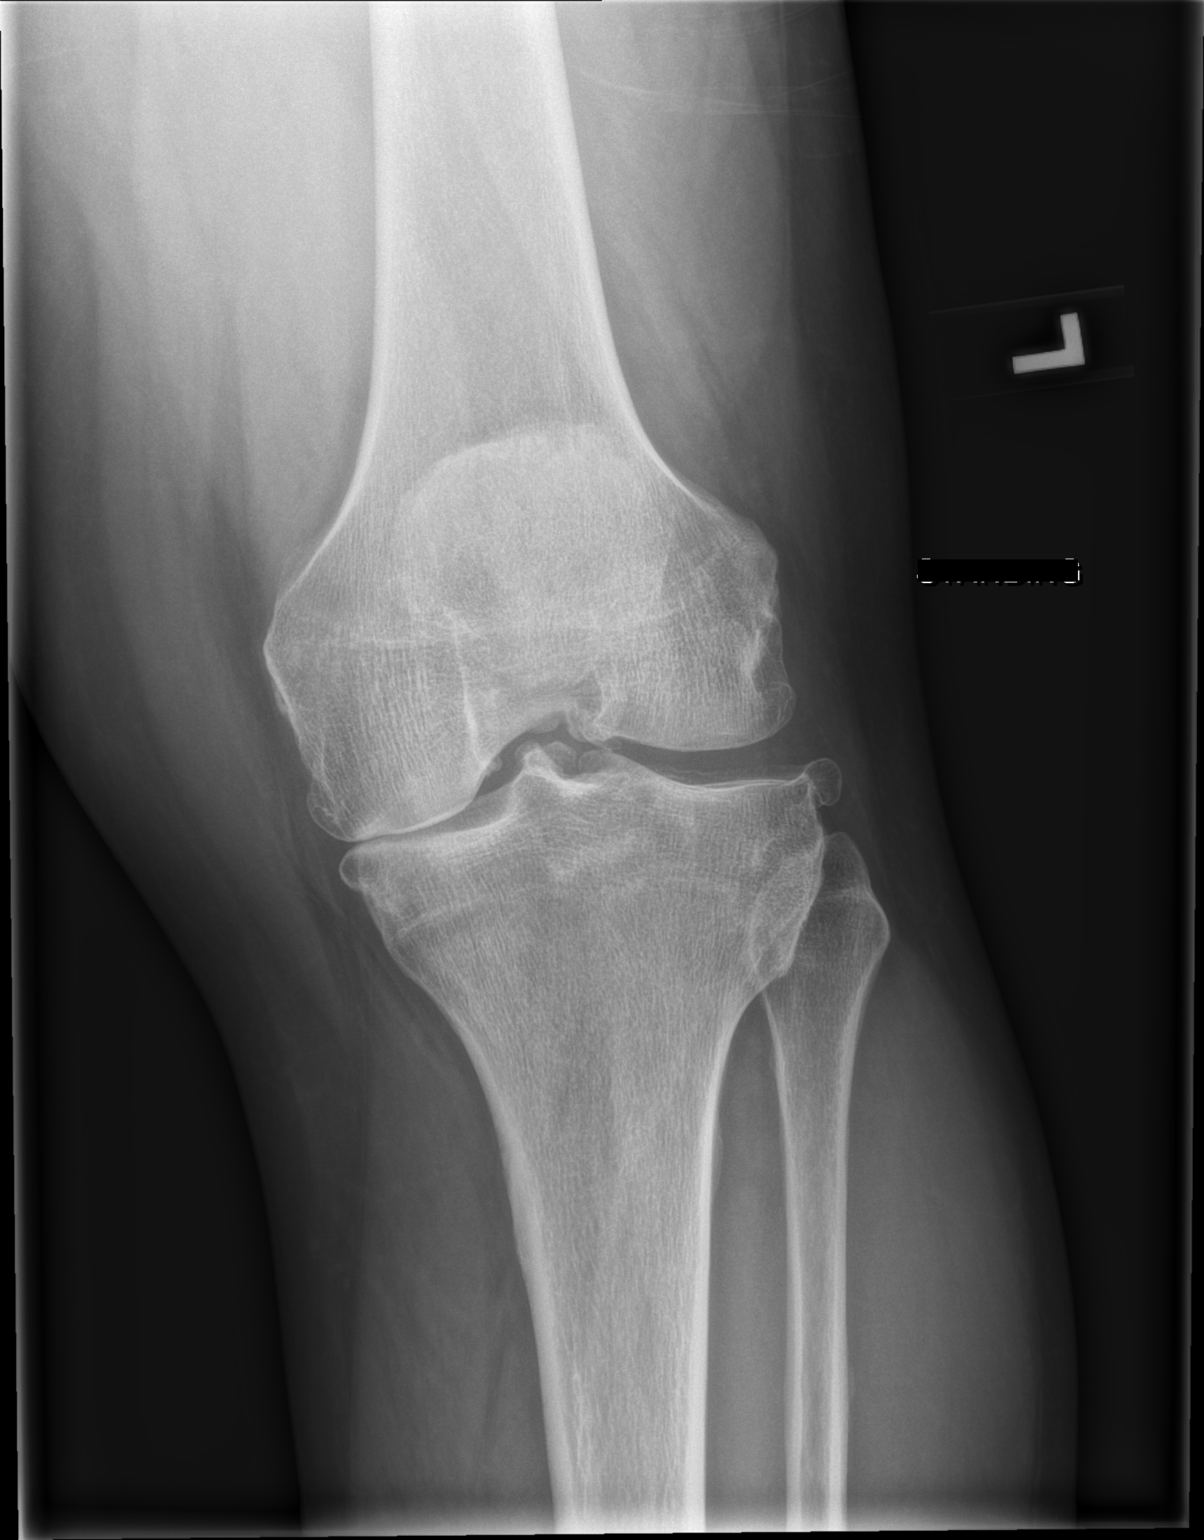

[w knee lat left]
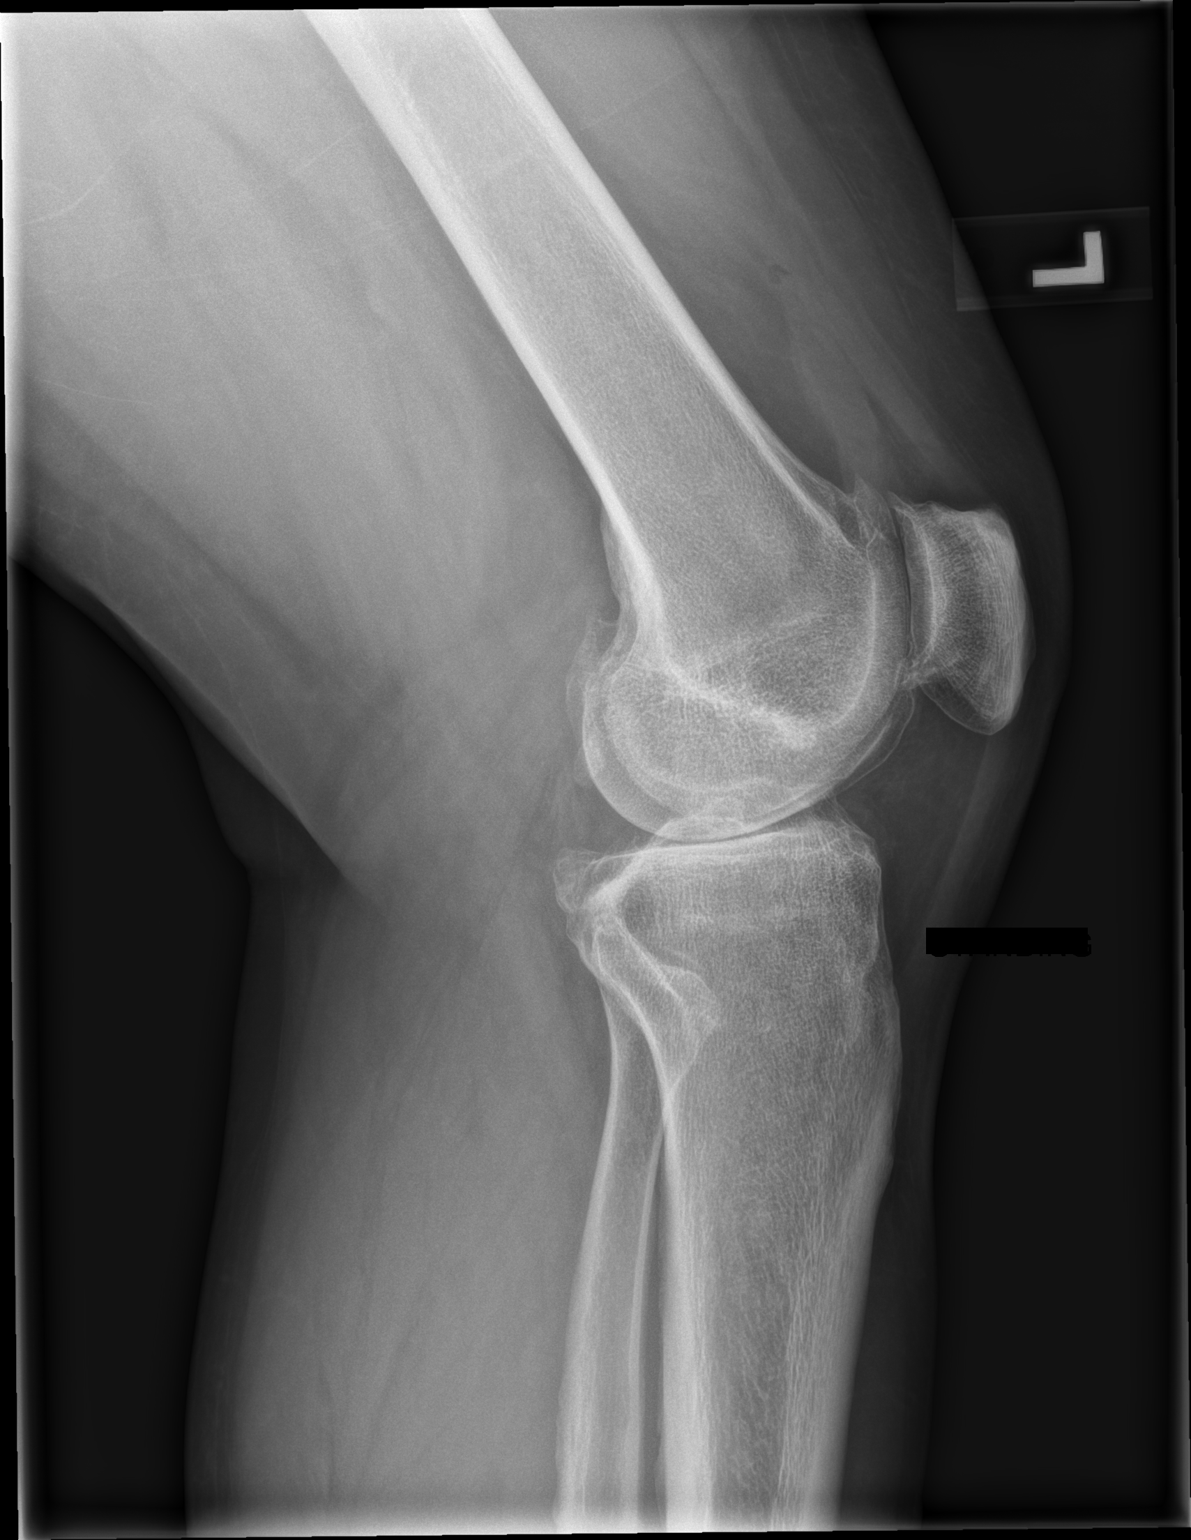

[x knee sunrise left]
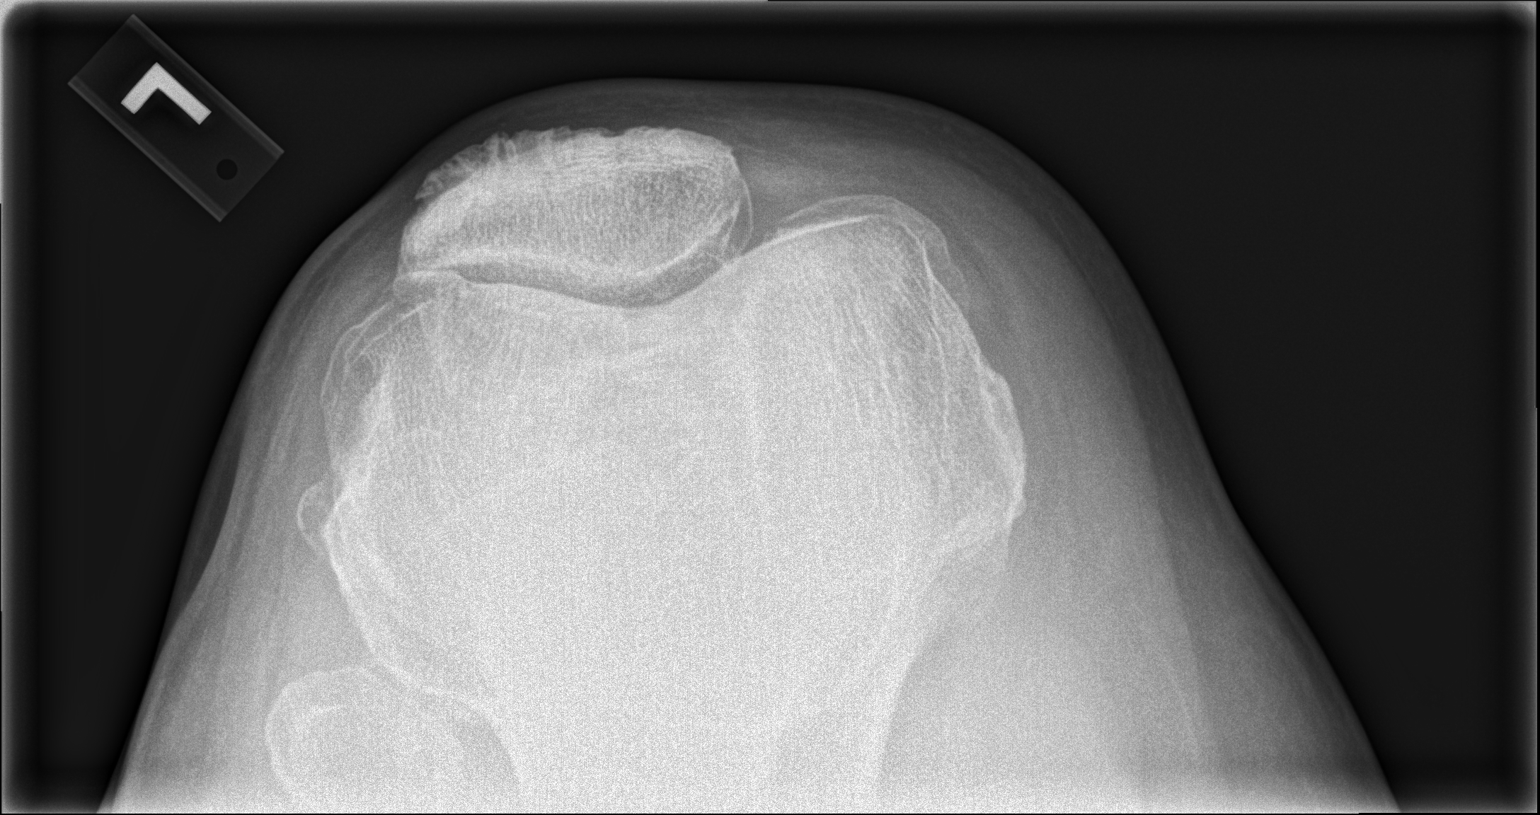

[3 of 3 positions shown; findings below may reference images not displayed]

FINDINGS: Tricompartmental degenerative changes are noted. Small joint
effusion is seen. No acute fracture or dislocation is noted. No soft
tissue abnormality is seen.
IMPRESSION: Tricompartmental degenerative change with mild joint effusion.

## 2022-11-20 DIAGNOSIS — M25562 Pain in left knee: Secondary | ICD-10-CM | POA: Diagnosis not present

## 2023-01-12 DIAGNOSIS — B029 Zoster without complications: Secondary | ICD-10-CM | POA: Diagnosis not present

## 2023-03-04 DIAGNOSIS — M109 Gout, unspecified: Secondary | ICD-10-CM | POA: Diagnosis not present

## 2023-03-04 DIAGNOSIS — R599 Enlarged lymph nodes, unspecified: Secondary | ICD-10-CM | POA: Diagnosis not present

## 2023-03-04 DIAGNOSIS — I10 Essential (primary) hypertension: Secondary | ICD-10-CM | POA: Diagnosis not present

## 2023-03-04 DIAGNOSIS — D6851 Activated protein C resistance: Secondary | ICD-10-CM | POA: Diagnosis not present

## 2023-03-04 DIAGNOSIS — Z125 Encounter for screening for malignant neoplasm of prostate: Secondary | ICD-10-CM | POA: Diagnosis not present

## 2023-03-04 DIAGNOSIS — R972 Elevated prostate specific antigen [PSA]: Secondary | ICD-10-CM | POA: Diagnosis not present

## 2023-03-04 DIAGNOSIS — E782 Mixed hyperlipidemia: Secondary | ICD-10-CM | POA: Diagnosis not present

## 2023-03-04 DIAGNOSIS — Z0000 Encounter for general adult medical examination without abnormal findings: Secondary | ICD-10-CM | POA: Diagnosis not present

## 2023-03-04 DIAGNOSIS — N1831 Chronic kidney disease, stage 3a: Secondary | ICD-10-CM | POA: Diagnosis not present

## 2023-03-04 DIAGNOSIS — M791 Myalgia, unspecified site: Secondary | ICD-10-CM | POA: Diagnosis not present

## 2023-09-11 DIAGNOSIS — R591 Generalized enlarged lymph nodes: Secondary | ICD-10-CM | POA: Diagnosis not present

## 2023-09-11 DIAGNOSIS — I10 Essential (primary) hypertension: Secondary | ICD-10-CM | POA: Diagnosis not present

## 2023-09-11 DIAGNOSIS — E782 Mixed hyperlipidemia: Secondary | ICD-10-CM | POA: Diagnosis not present

## 2023-09-11 DIAGNOSIS — M79672 Pain in left foot: Secondary | ICD-10-CM | POA: Diagnosis not present

## 2023-09-11 DIAGNOSIS — N1831 Chronic kidney disease, stage 3a: Secondary | ICD-10-CM | POA: Diagnosis not present

## 2023-09-19 DIAGNOSIS — R220 Localized swelling, mass and lump, head: Secondary | ICD-10-CM | POA: Diagnosis not present

## 2023-10-09 DIAGNOSIS — I82819 Embolism and thrombosis of superficial veins of unspecified lower extremities: Secondary | ICD-10-CM | POA: Diagnosis not present

## 2023-10-09 DIAGNOSIS — M79662 Pain in left lower leg: Secondary | ICD-10-CM | POA: Diagnosis not present

## 2023-10-09 DIAGNOSIS — R600 Localized edema: Secondary | ICD-10-CM | POA: Diagnosis not present

## 2023-10-09 DIAGNOSIS — I83892 Varicose veins of left lower extremities with other complications: Secondary | ICD-10-CM | POA: Diagnosis not present

## 2023-10-09 DIAGNOSIS — M25561 Pain in right knee: Secondary | ICD-10-CM | POA: Diagnosis not present

## 2023-10-09 DIAGNOSIS — M25562 Pain in left knee: Secondary | ICD-10-CM | POA: Diagnosis not present

## 2023-10-14 DIAGNOSIS — R221 Localized swelling, mass and lump, neck: Secondary | ICD-10-CM | POA: Diagnosis not present

## 2023-10-14 DIAGNOSIS — R590 Localized enlarged lymph nodes: Secondary | ICD-10-CM | POA: Diagnosis not present

## 2023-12-12 DIAGNOSIS — Z20822 Contact with and (suspected) exposure to covid-19: Secondary | ICD-10-CM | POA: Diagnosis not present

## 2023-12-12 DIAGNOSIS — J101 Influenza due to other identified influenza virus with other respiratory manifestations: Secondary | ICD-10-CM | POA: Diagnosis not present

## 2023-12-12 DIAGNOSIS — R519 Headache, unspecified: Secondary | ICD-10-CM | POA: Diagnosis not present

## 2024-03-08 DIAGNOSIS — M5442 Lumbago with sciatica, left side: Secondary | ICD-10-CM | POA: Diagnosis not present

## 2024-03-11 DIAGNOSIS — I10 Essential (primary) hypertension: Secondary | ICD-10-CM | POA: Diagnosis not present

## 2024-03-11 DIAGNOSIS — M109 Gout, unspecified: Secondary | ICD-10-CM | POA: Diagnosis not present

## 2024-03-11 DIAGNOSIS — E782 Mixed hyperlipidemia: Secondary | ICD-10-CM | POA: Diagnosis not present

## 2024-03-11 DIAGNOSIS — N1831 Chronic kidney disease, stage 3a: Secondary | ICD-10-CM | POA: Diagnosis not present

## 2024-04-25 DIAGNOSIS — M545 Low back pain, unspecified: Secondary | ICD-10-CM | POA: Diagnosis not present

## 2024-04-27 DIAGNOSIS — D6851 Activated protein C resistance: Secondary | ICD-10-CM | POA: Diagnosis not present

## 2024-04-27 DIAGNOSIS — M5417 Radiculopathy, lumbosacral region: Secondary | ICD-10-CM | POA: Diagnosis not present

## 2024-05-14 DIAGNOSIS — M545 Low back pain, unspecified: Secondary | ICD-10-CM | POA: Diagnosis not present

## 2024-06-02 DIAGNOSIS — I872 Venous insufficiency (chronic) (peripheral): Secondary | ICD-10-CM | POA: Diagnosis not present

## 2024-06-02 DIAGNOSIS — I87392 Chronic venous hypertension (idiopathic) with other complications of left lower extremity: Secondary | ICD-10-CM | POA: Diagnosis not present

## 2024-06-02 DIAGNOSIS — I10 Essential (primary) hypertension: Secondary | ICD-10-CM | POA: Diagnosis not present

## 2024-06-02 DIAGNOSIS — M79662 Pain in left lower leg: Secondary | ICD-10-CM | POA: Diagnosis not present

## 2024-06-02 DIAGNOSIS — R600 Localized edema: Secondary | ICD-10-CM | POA: Diagnosis not present

## 2024-06-08 DIAGNOSIS — M5417 Radiculopathy, lumbosacral region: Secondary | ICD-10-CM | POA: Diagnosis not present

## 2024-06-22 DIAGNOSIS — M545 Low back pain, unspecified: Secondary | ICD-10-CM | POA: Diagnosis not present

## 2024-07-01 DIAGNOSIS — M545 Low back pain, unspecified: Secondary | ICD-10-CM | POA: Diagnosis not present

## 2024-07-06 DIAGNOSIS — M5417 Radiculopathy, lumbosacral region: Secondary | ICD-10-CM | POA: Diagnosis not present

## 2024-09-17 DIAGNOSIS — N1831 Chronic kidney disease, stage 3a: Secondary | ICD-10-CM | POA: Diagnosis not present

## 2024-09-17 DIAGNOSIS — Z0000 Encounter for general adult medical examination without abnormal findings: Secondary | ICD-10-CM | POA: Diagnosis not present

## 2024-09-17 DIAGNOSIS — Z125 Encounter for screening for malignant neoplasm of prostate: Secondary | ICD-10-CM | POA: Diagnosis not present

## 2024-09-17 DIAGNOSIS — I10 Essential (primary) hypertension: Secondary | ICD-10-CM | POA: Diagnosis not present

## 2024-09-17 DIAGNOSIS — D6851 Activated protein C resistance: Secondary | ICD-10-CM | POA: Diagnosis not present

## 2024-09-17 DIAGNOSIS — J4540 Moderate persistent asthma, uncomplicated: Secondary | ICD-10-CM | POA: Diagnosis not present

## 2024-09-17 DIAGNOSIS — Z23 Encounter for immunization: Secondary | ICD-10-CM | POA: Diagnosis not present

## 2024-09-17 DIAGNOSIS — E669 Obesity, unspecified: Secondary | ICD-10-CM | POA: Diagnosis not present

## 2024-09-17 DIAGNOSIS — E782 Mixed hyperlipidemia: Secondary | ICD-10-CM | POA: Diagnosis not present

## 2024-09-17 DIAGNOSIS — M109 Gout, unspecified: Secondary | ICD-10-CM | POA: Diagnosis not present
